# Patient Record
Sex: Female | Born: 1985 | Race: White | Hispanic: No | Marital: Single | Smoking: Current every day smoker
Health system: Southern US, Community
[De-identification: ages and names within clinical notes are randomized; demographics above are authoritative.]

## PROBLEM LIST (undated history)

## (undated) DIAGNOSIS — A630 Anogenital (venereal) warts: Secondary | ICD-10-CM

## (undated) DIAGNOSIS — F329 Major depressive disorder, single episode, unspecified: Secondary | ICD-10-CM

## (undated) DIAGNOSIS — F32A Depression, unspecified: Secondary | ICD-10-CM

## (undated) DIAGNOSIS — R87619 Unspecified abnormal cytological findings in specimens from cervix uteri: Secondary | ICD-10-CM

## (undated) HISTORY — DX: Anogenital (venereal) warts: A63.0

## (undated) HISTORY — PX: ADENOIDECTOMY: SUR15

## (undated) HISTORY — PX: COLPOSCOPY: SHX161

## (undated) HISTORY — DX: Depression, unspecified: F32.A

## (undated) HISTORY — DX: Major depressive disorder, single episode, unspecified: F32.9

## (undated) HISTORY — DX: Unspecified abnormal cytological findings in specimens from cervix uteri: R87.619

## (undated) HISTORY — PX: TONSILLECTOMY: SUR1361

---
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
## 2007-03-13 ENCOUNTER — Emergency Department (HOSPITAL_COMMUNITY): Admission: EM | Admit: 2007-03-13 | Discharge: 2007-03-13 | Payer: Self-pay | Admitting: Emergency Medicine

## 2007-06-04 ENCOUNTER — Emergency Department (HOSPITAL_COMMUNITY): Admission: EM | Admit: 2007-06-04 | Discharge: 2007-06-04 | Payer: Self-pay | Admitting: Emergency Medicine

## 2007-10-04 ENCOUNTER — Emergency Department (HOSPITAL_COMMUNITY): Admission: EM | Admit: 2007-10-04 | Discharge: 2007-10-04 | Payer: Self-pay | Admitting: Emergency Medicine

## 2007-11-20 ENCOUNTER — Encounter
Admission: RE | Admit: 2007-11-20 | Discharge: 2007-11-20 | Payer: Self-pay | Admitting: Physical Medicine & Rehabilitation

## 2008-06-19 ENCOUNTER — Emergency Department (HOSPITAL_COMMUNITY): Admission: EM | Admit: 2008-06-19 | Discharge: 2008-06-19 | Payer: Self-pay | Admitting: Emergency Medicine

## 2008-11-23 ENCOUNTER — Emergency Department (HOSPITAL_COMMUNITY): Admission: EM | Admit: 2008-11-23 | Discharge: 2008-11-23 | Payer: Self-pay | Admitting: Emergency Medicine

## 2010-03-16 ENCOUNTER — Ambulatory Visit (HOSPITAL_COMMUNITY): Admission: RE | Admit: 2010-03-16 | Discharge: 2010-03-16 | Payer: Self-pay | Admitting: Family Medicine

## 2010-06-16 ENCOUNTER — Emergency Department (HOSPITAL_COMMUNITY): Admission: EM | Admit: 2010-06-16 | Discharge: 2010-01-05 | Payer: Self-pay | Admitting: Emergency Medicine

## 2010-07-31 ENCOUNTER — Encounter: Payer: Self-pay | Admitting: General Surgery

## 2010-09-25 LAB — URINALYSIS, ROUTINE W REFLEX MICROSCOPIC
Bilirubin Urine: NEGATIVE
Ketones, ur: NEGATIVE mg/dL
Urobilinogen, UA: 0.2 mg/dL (ref 0.0–1.0)

## 2010-10-28 ENCOUNTER — Other Ambulatory Visit (HOSPITAL_COMMUNITY)
Admission: RE | Admit: 2010-10-28 | Discharge: 2010-10-28 | Disposition: A | Discharge status: Home / Self Care | Payer: Medicaid Other | Source: Ambulatory Visit | Attending: Obstetrics and Gynecology | Admitting: Obstetrics and Gynecology

## 2010-10-28 ENCOUNTER — Other Ambulatory Visit: Payer: Self-pay | Admitting: Adult Health

## 2010-10-28 DIAGNOSIS — Z01419 Encounter for gynecological examination (general) (routine) without abnormal findings: Secondary | ICD-10-CM | POA: Insufficient documentation

## 2010-10-28 DIAGNOSIS — Z113 Encounter for screening for infections with a predominantly sexual mode of transmission: Secondary | ICD-10-CM | POA: Insufficient documentation

## 2010-11-22 ENCOUNTER — Other Ambulatory Visit: Payer: Self-pay | Admitting: Obstetrics & Gynecology

## 2010-11-25 NOTE — Group Therapy Note (Signed)
NAMEMIYUKI, RZASA                 ACCOUNT NO.:  [id number]   MEDICAL RECORD NO.:  [id number]          PATIENT TYPE:  INP   LOCATION:  A401                          FACILITY:  [facility]   PHYSICIAN:  Tilda Burrow, M.D. DATE OF BIRTH:  10-28-1985   DATE OF PROCEDURE:  DATE OF DISCHARGE:                                   PROGRESS NOTE   Dorothy Strong finally started progressing through labor once her MVU's got up to  about 190-200, and she went from 6 cm to 10 cm and +3 station in  approximately 15 minutes.  I arrived to the birthing unit with the baby  noted to be crowning.  After about a 10 minute second stage, she had a  spontaneous vaginal delivery of a viable female infant at 1625 hours.  The  mouth and nose were suctioned with the bulb syringe prior to delivery of the  body.  A right compound hand was delivered first, and then the body  delivered rapidly after that.  Weight is 6 pounds, 15 ounces.  Apgars are 9  and 9.  Pitocin 20 units diluted in 1000 cc of lactated Ringer's was infused  rapidly IV.  The placenta separated spontaneously and delivered via  controlled cord traction and maternal pushing effort at 1629.  The placental  membranes separated from the placenta upon delivery and were left trailing  outside the introitus.  The placenta was inspected and appears to be intact  with a three vessel cord.  The membranes were grasped with a set of ring  forceps, twisted several times, and delivered with slow, steady traction.  It appears that all of the membranes were delivered, although I cannot be  100% sure that is the case.  A visual and digital inspection did not reveal  any further membranes.  The fundus was immediately firm, and very little  blood loss was noted.  Estimated blood loss 200 cc.  The vagina was then  inspected, and no lacerations were found.  The epidural catheter was then  removed with the blue tip visualized as being intact.      Dorothy Strong,  C.N.M.      Tilda Burrow, M.D.  Electronically Signed    FC/MEDQ  D:  12/19/2005  T:  12/19/2005  Job:  [id number]

## 2010-11-25 NOTE — Op Note (Signed)
[place]  Patient:    Dorothy Strong, Dorothy Strong Visit Number: [id number] MRN: [id number]          Service Type: OBV Location: PEDS 6152 01 Attending Physician:  Lucky Cowboy Dictated by:   Lucky Cowboy, M.D. Proc. Date: 08/31/01 Admit Date:  08/31/2001 Discharge Date: 09/01/2001   CC:         [facility]   Operative Report  PREOPERATIVE DIAGNOSIS:  Postoperative tonsillectomy hemorrhage.  POSTOPERATIVE DIAGNOSIS:  Postoperative tonsillectomy hemorrhage.  PROCEDURE:  Control of oropharyngeal hemorrhage.  SURGEON:  Lucky Cowboy, M.D.  ANESTHESIA:  General endotracheal anesthesia.  ESTIMATED BLOOD LOSS:  30 cc - 250 cc suctioned out from stomach which was old blood.  COMPLICATIONS:  None.  INDICATIONS:  This patient is a 25 year old female who underwent tonsillectomy on August 23, 2001.  Approximately 1-1/2 hours ago she developed profuse oral bleeding which lasted for approximately _____  hours.  In transient to the hospital, she also bled again and also bled as well on arrival on the pediatric floor.  FINDINGS:  The patient was noted to have active bleeding from the right superior pole.  This was controlled with cautery.  There was also a small amount of bleeding from the left mid pole with fluffing of the eschar.  DESCRIPTION OF PROCEDURE: The patient was taken to the operating room and placed on the table in the supine position.  She was then placed under general endotracheal anesthesia and the table rotated counterclockwise 90 degrees. The neck was gently extended using a shoulder roll.  The head and body were draped.  The eyes were taped shut.  Crowe-Davis mouth gag with a #3 tongue blade was then placed intraorally, opened and suspended on the Mayo stand.  A large clot was then removed from the right tonsillary fossa.  Active bleeding was encountered and controlled with suction cautery.  In the same light, eschar was  removed.  As it was fluffing off from the left tonsillar fossa in the mid pole, venous bleeding controlled using suction cautery.  Mouth gag was relaxed and reopened.  There was no residual bleeding.  The oral cavity was irrigated with normal saline which was suctioned out.  An NG tube was placed down the esophagus for suctioning of the gastric contents and 250 cc of old blood was evacuated.   The mouth gag was then removed noting no damage to the teeth or soft tissues.  The table was rotated clockwise 90 degrees to its original position.  The patient was awakened from anesthesia and extubated in the operating room.  She was taken to the post anesthesia care unit in stable condition.  There were no complications. Dictated by:   Lucky Cowboy, M.D. Attending Physician:  Lucky Cowboy DD:  08/31/01 TD:  09/01/01 Job: [id number]

## 2010-11-25 NOTE — Op Note (Signed)
[place]  Patient:    KARLENE, SOUTHARD Visit Number: [id number] MRN: [id number]          Service Type: DSU Location: [facility] Attending Physician:  Fernande Boyden Dictated by:   Gloris Manchester. Lazarus Salines, M.D. Proc. Date: 08/23/01 Admit Date:  08/23/2001   CC:         Dr. Phillips Odor                           Operative Report  PREOPERATIVE DIAGNOSIS:  Recurrent tonsillitis.  POSTOPERATIVE DIAGNOSIS:  Recurrent tonsillitis.  PROCEDURE:  Tonsillectomy, adenoidectomy.  SURGEON: Gloris Manchester. Lazarus Salines, M.D.  ANESTHESIA:  General orotracheal.  ESTIMATED BLOOD LOSS:  Minimal.  COMPLICATIONS:  None.  FINDINGS:  Somewhat congested anterior nose.  Embedded 2+ tonsils with some fibrosis.  Normal soft palate.  Moderate adenoid pad.  DESCRIPTION OF PROCEDURE:  With the patient in a comfortable supine position, general orotracheal anesthesia was induced without difficulty.  At an appropriate level, the table was turned 90 degrees and the patient placed in Trendelenburg.  A clean preparation and draping was accomplished.  Taking care to protect lips, teeth, and endotracheal tube, the Crowe-Davis mouth gag was introduced, expanded for visualization, and suspended from the Mayo stand in the standard fashion.  The findings were as described above.  The palate retractor were used to visualize the nasopharynx with the findings as described above.  Finally a nasal speculum was used to examine the anterior nose with the findings as described above.  Xylocaine 0.5% with 1:200,000 epinephrine, 7 cc total, was infiltrated into the peritonsillar planes for intraoperative hemostasis.  Several minutes were allowed for this to take effect.  The adenoid pad was freed from the nasopharynx in a single sharp pass in the midline using adenoid curettes.  The adenoids were removed from the field and passed off as specimen.  The nasopharynx was suctioned dry and packed  with saline-moistened tonsil sponges for hemostasis.  Beginning on the left side, the tonsil was grasped and retracted medially. The mucosa overlying the anterior and superior poles was coagulated and then cut down to the capsule of the tonsil.  Using the cautery tip as a blunt dissector, coagulating crossing vessels, and lysing fibrous bands as identified, the tonsil was dissected free of its muscular fossa from inferiorly upward.  The tonsil was removed in its entirety as determined by examination of both tonsil and fossa.  A small additional quantity of cautery rendered the fossa hemostatic.  After completing the left side, the right side was done in identical fashion.  After completing the tonsillectomy and rendering the oropharynx hemostatic, the nasopharynx was unpacked.  The red rubber catheter was passed through the nose and out the mouth to serve as a palate retractor.  Using suction cautery and indirect visualization, moderate lateral bands were ablated and the adenoid bed proper was coagulated for hemostasis.  The posterior pole of the inferior turbinates, which was very congested, was reduced with coagulation on both sides.  Upon achieving hemostasis in the nasopharynx, the oropharynx was again observed to be hemostatic.  At this point the palate retractor and mouth gag were relaxed for several minutes.  Upon re-expansion, hemostasis was persistent.  At this point the procedure was completed.  The palate retractor and mouth gag were relaxed and removed.  The dental status was intact.  The patient was returned to anesthesia, awakened, extubated, and transferred to recovery in stable  condition.  COMMENT:  A 25 year old white female with recurrent tonsillitis, including hypertrophy during infection with difficulty swallowing, was the indication for todays procedure.  Anticipate a routine postoperative recovery with attention to analgesia, antibiosis, hydration, and observation  for bleeding, emesis, or airway compromise. Dictated by:   Gloris Manchester. Lazarus Salines, M.D. Attending Physician:  Fernande Boyden DD:  08/23/01 TD:  08/23/01 Job: [id number]

## 2010-11-25 NOTE — H&P (Signed)
Dorothy Strong, Dorothy Strong                 ACCOUNT NO.:  [id number]   MEDICAL RECORD NO.:  [id number]          PATIENT TYPE:  INP   LOCATION:  LDR2                          FACILITY:  [facility]   PHYSICIAN:  Tilda Burrow, M.D. DATE OF BIRTH:  Nov 25, 1985   DATE OF ADMISSION:  12/18/2005  DATE OF DISCHARGE:  LH                                HISTORY & PHYSICAL   ADMISSION DIAGNOSES:  1.  Pregnancy at 40-1/[redacted] weeks gestation.  2.  Impending post dates.  3.  Medical induction of labor.   HISTORY OF PRESENT ILLNESS:  This 26 year old female, gravida 1, para 0, LMP  February 07, 2005, placing [facility] May 8 with ultrasound-corrected [facility] of June 7  based on 5 week 6 day ultrasound and confirmed at 20 weeks.  She is now 40  weeks and a few days of this criteria, has cervix which has softened to 1  cm, 25%, -1, mid position. Vertex presentation is confirmed.  She is  admitted for Foley bulb cervical ripening and Pitocin induction of labor.   PAST MEDICAL HISTORY:  1.  Notable for supraventricular tachycardia  as a child as the patient was      a premature, delivered at 8 months gestation.  2.  History of depression.   PAST SURGICAL HISTORY:  Tonsillectomy   HABITS:  Cigarettes: 1 pack per day, early in pregnancy, quit.   ALLERGIES:  None.   SOCIAL HISTORY:  Single, lives with Ella Bodo.  She plans to bottle feed.  The baby is a girl.  She plans to take the baby to [facility], Dr. Phillips Odor.   PHYSICAL EXAMINATION:  GENERAL:  Healthy-appearing Caucasian female resting  comfortably.  Fetal heart rate tracing shows anterior padding.  VITAL SIGNS: Height 5 feet 5 inches, weight 145 which is a 33-pound weight  gain.  Fundal height 35 cm.  Estimated fetal weight 7 pounds.  PELVIC: Cervix 1, 25, -1.   LABORATORY DATA:  Pertinent labs include blood type O positive. Rubella  immunity present.  Hemoglobin 14, hematocrit 42.  Hepatitis, HIV, RPR, GC,  and chlamydia are all negative.  MSAFP  is normal.  The patient is going to  be supported by Amalia Hailey and her mother, Sheppard Evens.      Tilda Burrow, M.D.  Electronically Signed     JVF/MEDQ  D:  12/18/2005  T:  12/18/2005  Job:  [id number]   cc:   [facility]

## 2010-11-25 NOTE — Op Note (Signed)
NAMEASHLON, LOTTMAN NO.:  [id number]   MEDICAL RECORD NO.:  [id number]          PATIENT TYPE:  INP   LOCATION:  LDR2                          FACILITY:  [facility]   PHYSICIAN:  Lazaro Arms, M.D.   DATE OF BIRTH:  03-14-1986   DATE OF PROCEDURE:  DATE OF DISCHARGE:                                 OPERATIVE REPORT   EPIDURAL NOTE   Leshay is a 25 year old, white female, gravida 1, para 0, being induced with  her cervix being 3, 50 and -2 having regular contractions.  She has had two  doses of pain medicine and requesting epidural.   The patient is placed in the sitting position, Betadine prep is used and 1%  lidocaine is injected as a local anesthetic.  A 17-Gauge Tuohy needle was  used and loss of resistance technique employed.  The epidural space was  found with one pass without difficulty using loss of resistance technique.  Ten cc of 0.125% bupivacaine plain is given as a test dose without ill  effects.  The epidural catheter is fed and additional 10 cc is given through  the epidural catheter.  It was taped down 5 cm in the epidural space.  The  patient tolerated the procedure well.  She experienced no drop in blood  pressure.  Fetal heart rate tracing is stable.      Lazaro Arms, M.D.  Electronically Signed     LHE/MEDQ  D:  12/19/2005  T:  12/19/2005  Job:  [id number]

## 2011-01-27 ENCOUNTER — Ambulatory Visit (HOSPITAL_COMMUNITY)
Admission: RE | Admit: 2011-01-27 | Discharge: 2011-01-27 | Disposition: A | Discharge status: Home / Self Care | Payer: Medicaid Other | Source: Ambulatory Visit | Attending: Family Medicine | Admitting: Family Medicine

## 2011-01-27 ENCOUNTER — Other Ambulatory Visit (HOSPITAL_COMMUNITY): Payer: Self-pay | Admitting: Family Medicine

## 2011-01-27 DIAGNOSIS — X58XXXA Exposure to other specified factors, initial encounter: Secondary | ICD-10-CM | POA: Insufficient documentation

## 2011-01-27 DIAGNOSIS — M25476 Effusion, unspecified foot: Secondary | ICD-10-CM | POA: Insufficient documentation

## 2011-01-27 DIAGNOSIS — M779 Enthesopathy, unspecified: Secondary | ICD-10-CM

## 2011-01-27 DIAGNOSIS — M25579 Pain in unspecified ankle and joints of unspecified foot: Secondary | ICD-10-CM | POA: Insufficient documentation

## 2011-01-27 DIAGNOSIS — M25473 Effusion, unspecified ankle: Secondary | ICD-10-CM | POA: Insufficient documentation

## 2011-01-27 DIAGNOSIS — S92919A Unspecified fracture of unspecified toe(s), initial encounter for closed fracture: Secondary | ICD-10-CM | POA: Insufficient documentation

## 2011-04-02 ENCOUNTER — Encounter: Payer: Self-pay | Admitting: *Deleted

## 2011-04-02 ENCOUNTER — Emergency Department (HOSPITAL_COMMUNITY)
Admission: EM | Admit: 2011-04-02 | Discharge: 2011-04-02 | Discharge status: Against Medical Advice | Payer: Medicaid Other | Attending: Emergency Medicine | Admitting: Emergency Medicine

## 2011-04-02 DIAGNOSIS — R109 Unspecified abdominal pain: Secondary | ICD-10-CM | POA: Insufficient documentation

## 2011-04-02 DIAGNOSIS — Z5320 Procedure and treatment not carried out because of patient's decision for unspecified reasons: Secondary | ICD-10-CM | POA: Insufficient documentation

## 2011-04-02 NOTE — ED Notes (Signed)
Pt c/o pink discharge and lower abd pain x 1 day

## 2011-04-03 LAB — URINALYSIS, ROUTINE W REFLEX MICROSCOPIC
Bilirubin Urine: NEGATIVE
Ketones, ur: NEGATIVE
Urobilinogen, UA: 0.2
pH: 6

## 2011-04-03 LAB — WET PREP, GENITAL: Yeast Wet Prep HPF POC: NONE SEEN

## 2011-04-03 LAB — GC/CHLAMYDIA PROBE AMP, GENITAL: GC Probe Amp, Genital: NEGATIVE

## 2011-04-14 LAB — URINE CULTURE: Colony Count: NO GROWTH

## 2011-04-14 LAB — URINALYSIS, ROUTINE W REFLEX MICROSCOPIC
Glucose, UA: NEGATIVE mg/dL
Hgb urine dipstick: NEGATIVE
Ketones, ur: NEGATIVE mg/dL
Leukocytes, UA: NEGATIVE
Nitrite: NEGATIVE
Protein, ur: 30 mg/dL — AB
Specific Gravity, Urine: >1.03 — ABNORMAL HIGH (ref 1.005–1.030)
Urobilinogen, UA: 0.2 mg/dL (ref 0.0–1.0)

## 2011-04-14 LAB — URINE MICROSCOPIC-ADD ON

## 2011-09-13 ENCOUNTER — Emergency Department (HOSPITAL_COMMUNITY)
Admission: EM | Admit: 2011-09-13 | Discharge: 2011-09-13 | Disposition: A | Discharge status: Home / Self Care | Payer: 59 | Attending: Emergency Medicine | Admitting: Emergency Medicine

## 2011-09-13 ENCOUNTER — Encounter (HOSPITAL_COMMUNITY): Payer: Self-pay | Admitting: *Deleted

## 2011-09-13 DIAGNOSIS — F17200 Nicotine dependence, unspecified, uncomplicated: Secondary | ICD-10-CM | POA: Insufficient documentation

## 2011-09-13 DIAGNOSIS — R61 Generalized hyperhidrosis: Secondary | ICD-10-CM | POA: Insufficient documentation

## 2011-09-13 DIAGNOSIS — F1120 Opioid dependence, uncomplicated: Secondary | ICD-10-CM | POA: Insufficient documentation

## 2011-09-13 DIAGNOSIS — R197 Diarrhea, unspecified: Secondary | ICD-10-CM | POA: Insufficient documentation

## 2011-09-13 LAB — BASIC METABOLIC PANEL
BUN: 8 mg/dL (ref 6–23)
CO2: 28 mEq/L (ref 19–32)
Chloride: 103 mEq/L (ref 96–112)
Creatinine, Ser: 0.55 mg/dL (ref 0.50–1.10)
Glucose, Bld: 85 mg/dL (ref 70–99)

## 2011-09-13 LAB — DIFFERENTIAL
Lymphocytes Relative: 37 % (ref 12–46)
Monocytes Absolute: 0.9 10*3/uL (ref 0.1–1.0)
Monocytes Relative: 9 % (ref 3–12)
Neutro Abs: 5.3 10*3/uL (ref 1.7–7.7)

## 2011-09-13 LAB — CBC
HCT: 39.1 % (ref 36.0–46.0)
Hemoglobin: 13.8 g/dL (ref 12.0–15.0)
MCHC: 35.3 g/dL (ref 30.0–36.0)
WBC: 10.4 10*3/uL (ref 4.0–10.5)

## 2011-09-13 NOTE — ED Notes (Signed)
Evaluation for involuntary commitment

## 2011-09-13 NOTE — ED Provider Notes (Signed)
History  This chart was scribed for Loren Racer, MD by Bennett Scrape. This patient was seen in room APA15/APA15 and the patient's care was started at 5:21PM.  CSN: [id number]  Arrival date & time 09/13/11  1535   First MD Initiated Contact with Patient 09/13/11 1706      Chief Complaint  Patient presents with  . V70.1    The history is provided by the patient. No language interpreter was used.    Dorothy Strong is a 26 y.o. female who presents to the Emergency Department for involuntary commitment. Pt states that her mother called the police after receiving a text from an ex-boyfriend stating that she was using heroin. Pt states that her mother expressed concern for her granddaughter's safety if drug use was occuring in the pt's home. Pt denies these allegations but does admit to weaning herself of oxycodone after a long battle with abuse. Pt states that she has weaned herself down to a 1/2 a pill once a day. She c/o diarrhea and hot/cold sweats that she attributes to the withdrawal process.  She denies having any SI, HI or hallucinations. She denies having any previous  Overdoses. She denies having any previous psychiatric diagnoses. She denies having any other illnesses or injuries at this time. She has no h/o chronic medical conditions. She is a current everyday smoker but denies alcohol use.  History reviewed. No pertinent past medical history.  Past Surgical History  Procedure Date  . Tonsillectomy   . Adenoidectomy     No family history on file.  History  Substance Use Topics  . Smoking status: Current Everyday Smoker  . Smokeless tobacco: Not on file  . Alcohol Use: No     Review of Systems  Constitutional: Positive for chills. Negative for fever.  HENT: Negative for congestion, sore throat and neck pain.   Eyes: Negative for pain.  Respiratory: Negative for cough and shortness of breath.   Cardiovascular: Negative for chest pain.  Gastrointestinal: Positive for  diarrhea. Negative for nausea, vomiting and abdominal pain.  Genitourinary: Negative for dysuria and hematuria.  Musculoskeletal: Negative for back pain.  Skin: Negative for rash.  Neurological: Negative for numbness and headaches.  Psychiatric/Behavioral: Negative for suicidal ideas, hallucinations and confusion.    Allergies  Review of patient's allergies indicates no known allergies.  Home Medications   Current Outpatient Rx  Name Route Sig Dispense Refill  . ALBUTEROL SULFATE HFA 108 (90 BASE) MCG/ACT IN AERS Inhalation Inhale 2 puffs into the lungs every 6 (six) hours as needed. For rescue    . ALPRAZOLAM 1 MG PO TABS Oral Take 1 mg by mouth daily as needed. For anxiety    . UNKNOWN TO PATIENT Topical Apply 1 application topically daily. CREAM FOR ECZEMA    . IMPLANON [place] Subcutaneous Inject into the skin.        Triage Vitals: BP 125/78  Pulse 102  Temp(Src) 97.7 F (36.5 C) (Oral)  Resp 20  Ht 5\' 5"  (1.651 m)  Wt 125 lb (56.7 kg)  BMI 20.80 kg/m2  SpO2 100%  Physical Exam  Nursing note and vitals reviewed. Constitutional: She is oriented to person, place, and time. She appears well-developed and well-nourished.  HENT:  Head: Normocephalic and atraumatic.       Moist  Eyes: EOM are normal. Pupils are equal, round, and reactive to light.  Neck: Normal range of motion. Neck supple.  Cardiovascular: Normal rate and regular rhythm.   Pulmonary/Chest: Effort normal  and breath sounds normal. No respiratory distress.  Abdominal: Soft. She exhibits no distension. There is no tenderness.  Musculoskeletal: Normal range of motion. She exhibits no edema and no tenderness.  Neurological: She is alert and oriented to person, place, and time. No cranial nerve deficit.       5 out 5 strength, coordination intact, neurologically intact  Skin: Skin is warm and dry.  Psychiatric: She has a normal mood and affect. Her behavior is normal.       No SI, no HI, no psychosis  hallucinations, pt has good insight    ED Course  Procedures (including critical care time)  DIAGNOSTIC STUDIES: Oxygen Saturation is 100% on room air, normal by my interpretation.    COORDINATION OF CARE: 5:30Pm-Discussed treatment plan with pt and pt agreed to plan. 6:17PM-[facility] evaluated pt and decided to reverse the involuntary commitment. Pt will be discharged home.  Labs Reviewed  BASIC METABOLIC PANEL - Abnormal; Notable for the following:    Potassium 3.4 (*)    All other components within normal limits  CBC  DIFFERENTIAL  ETHANOL  URINE RAPID DRUG SCREEN (HOSP PERFORMED)  HCG, SERUM, QUALITATIVE   No results found.   1. Opioid dependence       MDM    I personally performed the services described in this documentation, which was scribed in my presence. The recorded information has been reviewed and considered.   Agree with findings of psychiatrist.    Loren Racer, MD 09/13/11 [phone]

## 2011-09-13 NOTE — ED Notes (Signed)
Per [facility] pt is to be discharged home,

## 2011-09-13 NOTE — ED Notes (Signed)
Pt presents to ed with [facility] with IVC paperwork, pt states that she is here because her boyfriend went to pt's mother and told her that pt had been "shooting up", pt tearful, denies any SI/HI, pt does admit to using narcotic pain medication but states that she has slowly weaned herself off them, pt states that she currently takes a half a tablet a day, does admit to GI upset since weaning her dosage down. Pt cooperative, [employer] at bedside,

## 2011-09-13 NOTE — ED Notes (Signed)
Pt's mother Dorothy Strong called and requested to speak with edp, adivsed pt's mother that no information could be given out without pt's permission, pt refused to given permission for anyone to speak with her mother.

## 2011-09-13 NOTE — ED Notes (Signed)
Patient is accompanied by [facility]

## 2012-03-19 ENCOUNTER — Other Ambulatory Visit: Payer: Self-pay | Admitting: Adult Health

## 2012-03-19 ENCOUNTER — Other Ambulatory Visit (HOSPITAL_COMMUNITY)
Admission: RE | Admit: 2012-03-19 | Discharge: 2012-03-19 | Disposition: A | Discharge status: Home / Self Care | Payer: Medicaid Other | Source: Ambulatory Visit | Attending: Obstetrics and Gynecology | Admitting: Obstetrics and Gynecology

## 2012-03-19 DIAGNOSIS — Z3049 Encounter for surveillance of other contraceptives: Secondary | ICD-10-CM | POA: Insufficient documentation

## 2012-03-19 DIAGNOSIS — Z113 Encounter for screening for infections with a predominantly sexual mode of transmission: Secondary | ICD-10-CM | POA: Insufficient documentation

## 2012-04-13 ENCOUNTER — Emergency Department (HOSPITAL_COMMUNITY)
Admission: EM | Admit: 2012-04-13 | Discharge: 2012-04-14 | Disposition: A | Discharge status: Home / Self Care | Payer: Medicaid Other | Attending: Emergency Medicine | Admitting: Emergency Medicine

## 2012-04-13 ENCOUNTER — Encounter (HOSPITAL_COMMUNITY): Payer: Self-pay | Admitting: *Deleted

## 2012-04-13 DIAGNOSIS — F17200 Nicotine dependence, unspecified, uncomplicated: Secondary | ICD-10-CM | POA: Insufficient documentation

## 2012-04-13 DIAGNOSIS — E860 Dehydration: Secondary | ICD-10-CM | POA: Insufficient documentation

## 2012-04-13 DIAGNOSIS — N12 Tubulo-interstitial nephritis, not specified as acute or chronic: Secondary | ICD-10-CM | POA: Insufficient documentation

## 2012-04-13 DIAGNOSIS — R809 Proteinuria, unspecified: Secondary | ICD-10-CM

## 2012-04-13 MED ORDER — KETOROLAC TROMETHAMINE 30 MG/ML IJ SOLN
30.0000 mg | Freq: Once | INTRAMUSCULAR | Status: AC
  Administered 2012-04-14: 30 mg via INTRAVENOUS
  Filled 2012-04-13: qty 1

## 2012-04-13 MED ORDER — ONDANSETRON HCL 4 MG/2ML IJ SOLN
4.0000 mg | Freq: Once | INTRAMUSCULAR | Status: AC
  Administered 2012-04-14: 4 mg via INTRAVENOUS
  Filled 2012-04-13: qty 2

## 2012-04-13 MED ORDER — SODIUM CHLORIDE 0.9 % IV BOLUS (SEPSIS)
1000.0000 mL | Freq: Once | INTRAVENOUS | Status: AC
  Administered 2012-04-14: 1000 mL via INTRAVENOUS

## 2012-04-13 NOTE — ED Provider Notes (Addendum)
History   This chart was scribed for Vida Roller, MD scribed by Magnus Sinning. The patient was seen in room APA01/APA01 at 23:53   CSN: [id number]  Arrival date & time 04/13/12  2321    Chief Complaint  Patient presents with  . Flank Pain    (Consider location/radiation/quality/duration/timing/severity/associated sxs/prior treatment) The history is provided by the patient.   Dorothy Strong is a 26 y.o. female who presents to the Emergency Department complaining of gradual onset of urinary frequency and constant flank pain that has become moderate to severe. The onset was yesterday with associated HA, increased urinary frequency at every 30 minutes, mild nausea, and a fever of 103. Nothing makes this better or worse, she tried AZO without improvement. States she has hx of urinary infections and explains she tried to drink fluids and says she has taken azo and ibuprofen with minimal relief. She denies hx of kidney infections,dysuria, cough, SOB, rash,vaginal bleeding, vaginal discharge, or abd surgeries,DM, hepatitis, or HIV.  She admits to headache  History reviewed. No pertinent past medical history.  Past Surgical History  Procedure Date  . Tonsillectomy   . Adenoidectomy     History reviewed. No pertinent family history.  History  Substance Use Topics  . Smoking status: Current Every Day Smoker -- 1.0 packs/day    Types: Cigarettes  . Smokeless tobacco: Not on file  . Alcohol Use: No   Review of Systems  All other systems reviewed and are negative.   10 Systems reviewed and are negative for acute change except as noted in the HPI. Allergies  Review of patient's allergies indicates no known allergies.  Home Medications   Current Outpatient Rx  Name Route Sig Dispense Refill  . ALBUTEROL SULFATE HFA 108 (90 BASE) MCG/ACT IN AERS Inhalation Inhale 2 puffs into the lungs every 6 (six) hours as needed. For rescue    . ALPRAZOLAM 1 MG PO TABS Oral Take 1 mg by mouth  daily as needed. For anxiety    . CEPHALEXIN 500 MG PO CAPS Oral Take 1 capsule (500 mg total) by mouth 4 (four) times daily. 40 capsule 0  . IMPLANON [place] Subcutaneous Inject into the skin.      Marland Kitchen NAPROXEN 500 MG PO TABS Oral Take 1 tablet (500 mg total) by mouth 2 (two) times daily with a meal. 30 tablet 0  . OXYCODONE-ACETAMINOPHEN 5-325 MG PO TABS Oral Take 1 tablet by mouth every 4 (four) hours as needed for pain. 20 tablet 0  . PROMETHAZINE HCL 25 MG PO TABS Oral Take 1 tablet (25 mg total) by mouth every 6 (six) hours as needed for nausea. 12 tablet 0  . UNKNOWN TO PATIENT Topical Apply 1 application topically daily. CREAM FOR ECZEMA      BP 124/70  Pulse 127  Temp 99.3 F (37.4 C) (Oral)  Resp 16  Ht 5\' 5"  (1.651 m)  Wt 130 lb (58.968 kg)  BMI 21.63 kg/m2  SpO2 94%  Physical Exam  Nursing note and vitals reviewed. Constitutional: She is oriented to person, place, and time. She appears well-developed and well-nourished. No distress.  HENT:  Head: Normocephalic and atraumatic.  Eyes: Conjunctivae normal and EOM are normal.  Neck: Neck supple. No tracheal deviation present.  Cardiovascular: Tachycardia present.        Tachycardic at 110  Pulmonary/Chest: Effort normal. No respiratory distress.  Abdominal: She exhibits no distension. There is tenderness.       Suprapubic tenderness but  no pain at McBurneys point. Right CVA tenderness.  Musculoskeletal: Normal range of motion.  Neurological: She is alert and oriented to person, place, and time. No sensory deficit.  Skin: Skin is dry.  Psychiatric: She has a normal mood and affect. Her behavior is normal.    ED Course  Procedures (including critical care time) DIAGNOSTIC STUDIES: Oxygen Saturation is 94% on room air, normal by my interpretation.    COORDINATION OF CARE:  Labs Reviewed  URINALYSIS, ROUTINE W REFLEX MICROSCOPIC - Abnormal; Notable for the following:    Color, Urine ORANGE (*)  BIOCHEMICALS MAY BE AFFECTED  BY COLOR   Glucose, UA 250 (*)     Hgb urine dipstick TRACE (*)     Bilirubin Urine MODERATE (*)     Ketones, ur >80 (*)     Protein, ur >300 (*)     Urobilinogen, UA >8.0 (*)     Nitrite POSITIVE (*)     Leukocytes, UA MODERATE (*)     All other components within normal limits  URINE MICROSCOPIC-ADD ON - Abnormal; Notable for the following:    Squamous Epithelial / LPF MANY (*)     Bacteria, UA MANY (*)     All other components within normal limits  PREGNANCY, URINE   No results found.   1. Pyelonephritis   2. Dehydration   3. Proteinuria       MDM  The patient has tachycardia, flank pain with urinary infections she has pyelonephritis clinically. She has had fever as high as 103 at home though she does not have a fever here. She will be given Tylenol, Rocephin, urine has been cultured as this is a complicated infection and the patient will likely be discharged home as she is tolerating by mouth. Will reduce tachycardia with fluids and medications, reevaluate.  Urinalysis shows many bacteria and the left and to 20 white blood cells with positive nitrite and moderate leukocytes. She also has a deep plus ketones in her urine and proteinuria. She has been informed of these results and agrees to followup for further testing should her symptoms worsen. She has been given IV rehydration with normal saline, intravenous Rocephin, Toradol, Zofran, acetaminophen.  Filed Vitals:   04/13/12 2335 04/14/12 0153  BP: 124/70 112/64  Pulse: 127 100  Temp: 99.3 F (37.4 C) 98.5 F (36.9 C)  TempSrc: Oral Oral  Resp: 16   Height: 5\' 5"  (1.651 m)   Weight: 130 lb (58.968 kg)   SpO2: 94% 96%   She has remained afebrile and has had a stable blood pressure. Her tachycardia has resolved with fluids and medications. She appears stable for discharge and has been made aware of all of her results and need for followup.  I personally performed the services described in this documentation, which was  scribed in my presence. The recorded information has been reviewed and considered.           Vida Roller, MD 04/14/12 0157  Vida Roller, MD 04/14/12 [phone]

## 2012-04-13 NOTE — ED Notes (Signed)
Pt c/o right sided flank pain starting yesterday. Pt also c/o headache with fever.

## 2012-04-14 LAB — PREGNANCY, URINE: Preg Test, Ur: NEGATIVE

## 2012-04-14 LAB — URINE MICROSCOPIC-ADD ON

## 2012-04-14 LAB — URINALYSIS, ROUTINE W REFLEX MICROSCOPIC
Glucose, UA: 250 mg/dL — AB
Specific Gravity, Urine: 1.025 (ref 1.005–1.030)

## 2012-04-14 MED ORDER — NAPROXEN 500 MG PO TABS: 500.0000 mg | ORAL_TABLET | Freq: Two times a day (BID) | ORAL | Status: DC

## 2012-04-14 MED ORDER — ACETAMINOPHEN 500 MG PO TABS
1000.0000 mg | ORAL_TABLET | Freq: Once | ORAL | Status: AC
  Administered 2012-04-14: 1000 mg via ORAL
  Filled 2012-04-14: qty 2

## 2012-04-14 MED ORDER — CEFTRIAXONE SODIUM 1 G IJ SOLR
INTRAMUSCULAR | Status: AC
  Filled 2012-04-14: qty 10

## 2012-04-14 MED ORDER — CEPHALEXIN 500 MG PO CAPS: 500.0000 mg | ORAL_CAPSULE | Freq: Four times a day (QID) | ORAL | Status: DC

## 2012-04-14 MED ORDER — PROMETHAZINE HCL 25 MG PO TABS: 25.0000 mg | ORAL_TABLET | Freq: Four times a day (QID) | ORAL | Status: DC | PRN

## 2012-04-14 MED ORDER — DEXTROSE 5 % IV SOLN
1.0000 g | Freq: Once | INTRAVENOUS | Status: AC
  Administered 2012-04-14: 1 g via INTRAVENOUS

## 2012-04-14 MED ORDER — OXYCODONE-ACETAMINOPHEN 5-325 MG PO TABS: 1.0000 | ORAL_TABLET | ORAL | Status: DC | PRN

## 2012-04-14 NOTE — ED Notes (Signed)
Pt discharged. Pt stable at time of discharge. Medications reviewed pt has no questions regarding discharge at this time. Pt voiced understanding of discharge instructions.  

## 2013-04-10 LAB — HM PAP SMEAR

## 2013-06-17 ENCOUNTER — Other Ambulatory Visit (HOSPITAL_COMMUNITY)
Admission: RE | Admit: 2013-06-17 | Discharge: 2013-06-17 | Disposition: A | Discharge status: Home / Self Care | Payer: Medicaid Other | Source: Ambulatory Visit | Attending: Nurse Practitioner | Admitting: Nurse Practitioner

## 2013-06-17 ENCOUNTER — Other Ambulatory Visit (HOSPITAL_COMMUNITY)
Admission: RE | Admit: 2013-06-17 | Discharge: 2013-06-17 | Disposition: A | Discharge status: Home / Self Care | Payer: Medicaid Other | Source: Ambulatory Visit | Attending: Unknown Physician Specialty | Admitting: Unknown Physician Specialty

## 2013-06-17 DIAGNOSIS — D069 Carcinoma in situ of cervix, unspecified: Secondary | ICD-10-CM | POA: Insufficient documentation

## 2013-06-17 DIAGNOSIS — R87623 High grade squamous intraepithelial lesion on cytologic smear of vagina (HGSIL): Secondary | ICD-10-CM | POA: Insufficient documentation

## 2013-07-21 ENCOUNTER — Encounter: Payer: Self-pay | Admitting: *Deleted

## 2013-07-21 DIAGNOSIS — IMO0002 Reserved for concepts with insufficient information to code with codable children: Secondary | ICD-10-CM | POA: Insufficient documentation

## 2013-07-21 DIAGNOSIS — A630 Anogenital (venereal) warts: Secondary | ICD-10-CM | POA: Insufficient documentation

## 2013-07-21 DIAGNOSIS — R87619 Unspecified abnormal cytological findings in specimens from cervix uteri: Secondary | ICD-10-CM | POA: Insufficient documentation

## 2013-07-30 ENCOUNTER — Ambulatory Visit: Payer: Self-pay | Admitting: Obstetrics and Gynecology

## 2013-07-30 ENCOUNTER — Encounter: Payer: Self-pay | Admitting: Obstetrics and Gynecology

## 2013-07-30 VITALS — BP 100/60 | Ht 65.0 in | Wt 137.0 lb

## 2013-07-30 DIAGNOSIS — N879 Dysplasia of cervix uteri, unspecified: Secondary | ICD-10-CM

## 2013-07-31 NOTE — Progress Notes (Signed)
Patient ID: Dorothy SaxEmily A Knowlton, female   DOB: [birth year], 28 y.o.   MRN: [id number]  Chief Complaint  Patient presents with  . [facility]    HPI Dorothy Strong is a 28 y.o. female.  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 Records are not available. The patient came to her appointment in approximately 3 PM, went to the bathroom, was thought of left then returned to the waiting room and at 420 and was realized that the patient was still here. Arrangements for LEEP are not considered reasonable in this late times a day and will be rescheduled for better time she now The patient is accepting of rescheduling HPI  Past Medical History  Diagnosis Date  . Condyloma acuminatum   . Depression   . Abnormal Pap smear of cervix     Past Surgical History  Procedure Laterality Date  . Tonsillectomy    . Adenoidectomy    . Colposcopy      Family History  Problem Relation Age of Onset  . Cancer Other     prostate   . Diabetes Other   . Arthritis Other   . Hypertension Other   . Hypertension Father     Social History History  Substance Use Topics  . Smoking status: Current Every Day Smoker -- 1.00 packs/day    Types: Cigarettes  . Smokeless tobacco: Never Used  . Alcohol Use: No    No Known Allergies  Current Outpatient Prescriptions  Medication Sig Dispense Refill  . Etonogestrel (IMPLANON [place]) Inject into the skin.         No current facility-administered medications for this visit.    Review of Systems Review of Systems  Blood pressure 100/60, height 5\' 5"  (1.651 m), weight 137 lb (62.143 kg).  Physical Exam Physical Exam  Data Reviewed Records request made  Assessment    Cervical dysplasia for LEEP procedure are office, records being requested and procedure being rescheduled    Plan           Cinthya Bors V 07/31/2013, 5:31 PM

## 2013-08-05 ENCOUNTER — Other Ambulatory Visit: Payer: Self-pay | Admitting: Obstetrics and Gynecology

## 2013-08-05 ENCOUNTER — Ambulatory Visit (INDEPENDENT_AMBULATORY_CARE_PROVIDER_SITE_OTHER): Payer: Self-pay | Admitting: Obstetrics and Gynecology

## 2013-08-05 ENCOUNTER — Encounter: Payer: Self-pay | Admitting: Obstetrics and Gynecology

## 2013-08-05 VITALS — Ht 65.0 in | Wt 139.0 lb

## 2013-08-05 DIAGNOSIS — N871 Moderate cervical dysplasia: Secondary | ICD-10-CM

## 2013-08-05 DIAGNOSIS — D069 Carcinoma in situ of cervix, unspecified: Secondary | ICD-10-CM

## 2013-08-05 DIAGNOSIS — R87613 High grade squamous intraepithelial lesion on cytologic smear of cervix (HGSIL): Secondary | ICD-10-CM

## 2013-08-05 DIAGNOSIS — Z3202 Encounter for pregnancy test, result negative: Secondary | ICD-10-CM

## 2013-08-05 LAB — POCT URINE PREGNANCY: PREG TEST UR: NEGATIVE

## 2013-08-05 NOTE — Progress Notes (Signed)
GYNECOLOGY CLINIC LEEP PROCEDURE NOTE   Risks, benefits, alternatives, and limitations of procedure explained to patient, including pain, bleeding, infection, failure to remove abnormal tissue and failure to cure dysplasia, need for repeat procedures, damage to pelvic organs, cervical incompetence.  Role of HPV,cervical dysplasia and need for close followup was empasized. Informed written consent was obtained. All questions were answered. Time out performed.   Procedure: The patient was placed in lithotomy position and the bivalved coated speculum was placed in the patient's vagina. A grounding pad placed on the patient. Lugol's solution was applied to the cervix and areas of decreased uptake were noted around the transformation zone.   Local anesthesia was administered via an intracervical block using 10cc of 2% Lidocaine with epinephrine. The suction was turned on and the Medium 1X Fisher Cone Biopsy Excisor on 50 Watts of cutting current was used to excise the area of decreased uptake and excise the entire transformation zone. Excellent hemostasis was achieved,. Monsel's solution was then applied and the speculum was removed from the vagina. Specimens were sent to pathology.  The patient tolerated the procedure well. Post-operative instructions given to patient, including instruction to seek medical attention for persistent bright red bleeding, fever, abdominal/pelvic pain, dysuria, nausea or vomiting. She was also told about the possibility of having copious yellow to black tinged discharge for weeks. She was counseled to avoid anything in the vagina (sex/douching/tampons) for 3 weeks. She has a 3 week post-operative check to assess wound healing, review results and discuss further management.

## 2013-08-05 NOTE — Patient Instructions (Signed)

## 2013-08-06 ENCOUNTER — Encounter: Payer: Self-pay | Admitting: Obstetrics and Gynecology

## 2013-08-11 ENCOUNTER — Telehealth: Payer: Self-pay | Admitting: Obstetrics and Gynecology

## 2013-08-11 NOTE — Telephone Encounter (Signed)
Pt informed of results , has appt 2/18, will colpo at 3 months.

## 2013-08-18 ENCOUNTER — Telehealth: Payer: Self-pay | Admitting: *Deleted

## 2013-08-18 NOTE — Telephone Encounter (Signed)
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 ------

## 2013-08-21 NOTE — Telephone Encounter (Signed)
Spoke with pt letting her know she will need a colpo, and consideration of deeper leep, after healing from this procedure, in 3 months. Pt states she spoke with Dr. Emelda FearFerguson. Has appt scheduled 08/27/13 to discuss further. JSY

## 2013-08-27 ENCOUNTER — Ambulatory Visit: Payer: Self-pay | Admitting: Obstetrics and Gynecology

## 2013-09-10 ENCOUNTER — Ambulatory Visit (INDEPENDENT_AMBULATORY_CARE_PROVIDER_SITE_OTHER): Payer: Self-pay | Admitting: Obstetrics and Gynecology

## 2013-09-10 ENCOUNTER — Encounter: Payer: Self-pay | Admitting: Obstetrics and Gynecology

## 2013-09-10 ENCOUNTER — Encounter (INDEPENDENT_AMBULATORY_CARE_PROVIDER_SITE_OTHER): Payer: Self-pay

## 2013-09-10 VITALS — BP 112/82 | Ht 65.0 in | Wt 136.0 lb

## 2013-09-10 DIAGNOSIS — Z9889 Other specified postprocedural states: Secondary | ICD-10-CM | POA: Insufficient documentation

## 2013-09-10 NOTE — Progress Notes (Signed)
This chart was scribed by Bennett Scrapehristina Taylor, Medical Scribe, for Dr. Christin BachJohn Esta Carmon on 09/10/13 at 1:55 PM. This chart was reviewed by Dr. Christin BachJohn Katharin Schneider and is accurate.    [facility] Visit  Patient name: Dorothy Strong MRN [id number]  Date of birth: January 05, 1986  CC & HPI:  Dorothy Strong is a 28 y.o. female presenting today for LEEP recheck. 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 - ENDOCERVICAL MARGIN INVOLVED IN THE 12 TO 3 AND 3 T 6 O'CLOCK QUADRANTS. - ECTOCERVICAL MARGIN NOT INVOLVED.  ROS:  No complaints  Pertinent History Reviewed:  Medical & Surgical Hx:  Reviewed: Significant for abnormal PAP  Medications: Reviewed & Updated - see associated section Social History: Reviewed -  reports that she has been smoking Cigarettes.  She has been smoking about 1.00 pack per day. She has never used smokeless tobacco.  Objective Findings:  Vitals: BP 112/82  Ht 5\' 5"  (1.651 m)  Wt 136 lb (61.689 kg)  BMI 22.63 kg/m2 Chaperone present for exam. Exam performed with pt's permission without complications or severe discomfort.  Physical Examination: General appearance - alert, well appearing, and in no distress and oriented to person, place, and time Mental status - alert, oriented to person, place, and time, normal mood, behavior, speech, dress, motor activity, and thought processes Pelvic: cervix is well-healed s/p LEEP, some eversion has occurred.   Assessment & Plan:  A: LEEP: CIN-II-III with extension to inner margin at 9-12 and 12-3 quadrants : well-healing LEEP     P: colposcopy and possible endocervical edge LEEP in 3 months  annual PAPs x 20 yrs..Marland Kitchen

## 2013-10-15 ENCOUNTER — Encounter: Payer: Self-pay | Admitting: Obstetrics and Gynecology

## 2013-10-15 ENCOUNTER — Ambulatory Visit (INDEPENDENT_AMBULATORY_CARE_PROVIDER_SITE_OTHER): Payer: Self-pay | Admitting: Obstetrics and Gynecology

## 2013-10-15 VITALS — BP 140/92 | Ht 65.0 in | Wt 134.0 lb

## 2013-10-15 DIAGNOSIS — N3090 Cystitis, unspecified without hematuria: Secondary | ICD-10-CM | POA: Insufficient documentation

## 2013-10-15 DIAGNOSIS — N926 Irregular menstruation, unspecified: Secondary | ICD-10-CM

## 2013-10-15 DIAGNOSIS — R300 Dysuria: Secondary | ICD-10-CM

## 2013-10-15 DIAGNOSIS — N939 Abnormal uterine and vaginal bleeding, unspecified: Secondary | ICD-10-CM

## 2013-10-15 DIAGNOSIS — R309 Painful micturition, unspecified: Secondary | ICD-10-CM

## 2013-10-15 LAB — POCT URINALYSIS DIPSTICK
GLUCOSE UA: NEGATIVE
Ketones, UA: NEGATIVE
LEUKOCYTES UA: NEGATIVE
NITRITE UA: NEGATIVE
Protein, UA: NEGATIVE
RBC UA: 3

## 2013-10-15 MED ORDER — PHENAZOPYRIDINE HCL 200 MG PO TABS: 200.0000 mg | ORAL_TABLET | Freq: Three times a day (TID) | ORAL | Status: DC | PRN

## 2013-10-15 MED ORDER — MEGESTROL ACETATE 40 MG PO TABS: 40.0000 mg | ORAL_TABLET | Freq: Three times a day (TID) | ORAL | Status: DC

## 2013-10-15 MED ORDER — SULFAMETHOXAZOLE-TMP DS 800-160 MG PO TABS: 1.0000 | ORAL_TABLET | Freq: Two times a day (BID) | ORAL | Status: DC

## 2013-10-15 NOTE — Patient Instructions (Signed)
Dysfunctional Uterine Bleeding Normally, menstrual periods begin between ages 11 to 17 in young women. A normal menstrual cycle/period may begin every 23 days up to 35 days and lasts from 1 to 7 days. Around 12 to 14 days before your menstrual period starts, ovulation (ovary produces an egg) occurs. When counting the time between menstrual periods, count from the first day of bleeding of the previous period to the first day of bleeding of the next period. Dysfunctional (abnormal) uterine bleeding is bleeding that is different from a normal menstrual period. Your periods may come earlier or later than usual. They may be lighter, have blood clots or be heavier. You may have bleeding between periods, or you may skip one period or more. You may have bleeding after sexual intercourse, bleeding after menopause, or no menstrual period. CAUSES   Pregnancy (normal, miscarriage, tubal).  IUDs (intrauterine device, birth control).  Birth control pills.  Hormone treatment.  Menopause.  Infection of the cervix.  Blood clotting problems.  Infection of the inside lining of the uterus.  Endometriosis, inside lining of the uterus growing in the pelvis and other female organs.  Adhesions (scar tissue) inside the uterus.  Obesity or severe weight loss.  Uterine polyps inside the uterus.  Cancer of the vagina, cervix, or uterus.  Ovarian cysts or polycystic ovary syndrome.  Medical problems (diabetes, thyroid disease).  Uterine fibroids (noncancerous tumor).  Problems with your female hormones.  Endometrial hyperplasia, very thick lining and enlarged cells inside of the uterus.  Medicines that interfere with ovulation.  Radiation to the pelvis or abdomen.  Chemotherapy. DIAGNOSIS   Your doctor will discuss the history of your menstrual periods, medicines you are taking, changes in your weight, stress in your life, and any medical problems you may have.  Your doctor will do a physical  and pelvic examination.  Your doctor may want to perform certain tests to make a diagnosis, such as:  Pap test.  Blood tests.  Cultures for infection.  CT scan.  Ultrasound.  Hysteroscopy.  Laparoscopy.  MRI.  Hysterosalpingography.  D and C.  Endometrial biopsy. TREATMENT  Treatment will depend on the cause of the dysfunctional uterine bleeding (DUB). Treatment may include:  Observing your menstrual periods for a couple of months.  Prescribing medicines for medical problems, including:  Antibiotics.  Hormones.  Birth control pills.  Removing an IUD (intrauterine device, birth control).  Surgery:  D and C (scrape and remove tissue from inside the uterus).  Laparoscopy (examine inside the abdomen with a lighted tube).  Uterine ablation (destroy lining of the uterus with electrical current, laser, heat, or freezing).  Hysteroscopy (examine cervix and uterus with a lighted tube).  Hysterectomy (remove the uterus). HOME CARE INSTRUCTIONS   If medicines were prescribed, take exactly as directed. Do not change or switch medicines without consulting your caregiver.  Long term heavy bleeding may result in iron deficiency. Your caregiver may have prescribed iron pills. They help replace the iron that your body lost from heavy bleeding. Take exactly as directed.  Do not take aspirin or medicines that contain aspirin one week before or during your menstrual period. Aspirin may make the bleeding worse.  If you need to change your sanitary pad or tampon more than once every 2 hours, stay in bed with your feet elevated and a cold pack on your lower abdomen. Rest as much as possible, until the bleeding stops or slows down.  Eat well-balanced meals. Eat foods high in iron. Examples   are:  Leafy green vegetables.  Whole-grain breads and cereals.  Eggs.  Meat.  Liver.  Do not try to lose weight until the abnormal bleeding has stopped and your blood iron level is  back to normal. Do not lift more than ten pounds or do strenuous activities when you are bleeding.  For a couple of months, make note on your calendar, marking the start and ending of your period, and the type of bleeding (light, medium, heavy, spotting, clots or missed periods). This is for your caregiver to better evaluate your problem. SEEK MEDICAL CARE IF:   You develop nausea (feeling sick to your stomach) and vomiting, dizziness, or diarrhea while you are taking your medicine.  You are getting lightheaded or weak.  You have any problems that may be related to the medicine you are taking.  You develop pain with your DUB.  You want to remove your IUD.  You want to stop or change your birth control pills or hormones.  You have any type of abnormal bleeding mentioned above.  You are over 43 years old and have not had a menstrual period yet.  You are 28 years old and you are still having menstrual periods.  You have any of the symptoms mentioned above.  You develop a rash. SEEK IMMEDIATE MEDICAL CARE IF:   An oral temperature above 102 F (38.9 C) develops.  You develop chills.  You are changing your sanitary pad or tampon more than once an hour.  You develop abdominal pain.  You pass out or faint. Document Released: 06/23/2000 Document Revised: 09/18/2011 Document Reviewed: 05/25/2009 [facility] Patient Information 2014 [place], [place]. Urinary Tract Infection Urinary tract infections (UTIs) can develop anywhere along your urinary tract. Your urinary tract is your body's drainage system for removing wastes and extra water. Your urinary tract includes two kidneys, two ureters, a bladder, and a urethra. Your kidneys are a pair of bean-shaped organs. Each kidney is about the size of your fist. They are located below your ribs, one on each side of your spine. CAUSES Infections are caused by microbes, which are microscopic organisms, including fungi, viruses, and bacteria. These  organisms are so small that they can only be seen through a microscope. Bacteria are the microbes that most commonly cause UTIs. SYMPTOMS  Symptoms of UTIs may vary by age and gender of the patient and by the location of the infection. Symptoms in young women typically include a frequent and intense urge to urinate and a painful, burning feeling in the bladder or urethra during urination. Older women and men are more likely to be tired, shaky, and weak and have muscle aches and abdominal pain. A fever may mean the infection is in your kidneys. Other symptoms of a kidney infection include pain in your back or sides below the ribs, nausea, and vomiting. DIAGNOSIS To diagnose a UTI, your caregiver will ask you about your symptoms. Your caregiver also will ask to provide a urine sample. The urine sample will be tested for bacteria and white blood cells. White blood cells are made by your body to help fight infection. TREATMENT  Typically, UTIs can be treated with medication. Because most UTIs are caused by a bacterial infection, they usually can be treated with the use of antibiotics. The choice of antibiotic and length of treatment depend on your symptoms and the type of bacteria causing your infection. HOME CARE INSTRUCTIONS  If you were prescribed antibiotics, take them exactly as your caregiver instructs you. [employer]  the medication even if you feel better after you have only taken some of the medication.  Drink enough water and fluids to keep your urine clear or pale yellow.  Avoid caffeine, tea, and carbonated beverages. They tend to irritate your bladder.  Empty your bladder often. Avoid holding urine for long periods of time.  Empty your bladder before and after sexual intercourse.  After a bowel movement, women should cleanse from front to back. Use each tissue only once. SEEK MEDICAL CARE IF:   You have back pain.  You develop a fever.  Your symptoms do not begin to resolve within 3  days. SEEK IMMEDIATE MEDICAL CARE IF:   You have severe back pain or lower abdominal pain.  You develop chills.  You have nausea or vomiting.  You have continued burning or discomfort with urination. MAKE SURE YOU:   Understand these instructions.  Will watch your condition.  Will get help right away if you are not doing well or get worse. Document Released: 04/05/2005 Document Revised: 12/26/2011 Document Reviewed: 08/04/2011 [facility] Patient Information 2014 [place].

## 2013-10-15 NOTE — Progress Notes (Signed)
This chart was scribed by Bennett Scrapehristina Taylor, Medical Scribe, for Dr. Christin BachJohn Tiwana Chavis on 10/15/13 at 4:15 PM. This chart was reviewed by Dr. Christin BachJohn Linford Quintela and is accurate.   [facility] Visit  Patient name: Dorothy Strong MRN [id number]  Date of birth: 1985/12/07  CC & HPI:  Dorothy Saxmily A Doggett is a 28 y.o. female presenting today for UTI symptoms x2 days. Reports right lower back pain, RLQ pain described as pressure and dysuria. Taking Aleve with no relief.  Pt also c/o vaginal bleeding x1 month while on implanon.    ROS:  +dysuria +RLQ pain +right lower back pain No other complaints  Pertinent History Reviewed:  Medical & Surgical Hx:  Reviewed: Significant for colposcopy  Medications: Reviewed & Updated - see associated section Social History: Reviewed -  reports that she has been smoking Cigarettes.  She has been smoking about 1.00 pack per day. She has never used smokeless tobacco.  Objective Findings:  Vitals: BP 140/92  Ht 5\' 5"  (1.651 m)  Wt 134 lb (60.782 kg)  BMI 22.30 kg/m2  LMP 09/17/2013 Chaperone present for exam which was performed with pt's permission Physical Examination: General appearance - alert, well appearing, and in no distress, oriented to person, place, and time and normal appearing weight Mental status - alert, oriented to person, place, and time, normal mood, behavior, speech, dress, motor activity, and thought processes Pelvic - normal external genitalia, vulva, vagina, cervix, uterus and adnexa, tenderness over bladder UA: normal today  Assessment & Plan:  A: 1. Abnormal uterine bleeding on implanon 2. Bladder infection  P: 1. Megace 40 tid til bleeding stops  2. Pyridium 200mg   3 septra

## 2013-10-16 LAB — CULTURE, URINE COMPREHENSIVE
Colony Count: NO GROWTH
ORGANISM ID, BACTERIA: NO GROWTH

## 2013-12-04 ENCOUNTER — Encounter: Payer: Self-pay | Admitting: Obstetrics and Gynecology

## 2013-12-04 ENCOUNTER — Other Ambulatory Visit: Payer: Self-pay | Admitting: Obstetrics and Gynecology

## 2013-12-04 ENCOUNTER — Ambulatory Visit (INDEPENDENT_AMBULATORY_CARE_PROVIDER_SITE_OTHER): Payer: Self-pay | Admitting: Obstetrics and Gynecology

## 2013-12-04 VITALS — BP 120/80 | Ht 65.0 in | Wt 132.0 lb

## 2013-12-04 DIAGNOSIS — Z3202 Encounter for pregnancy test, result negative: Secondary | ICD-10-CM

## 2013-12-04 DIAGNOSIS — O3440 Maternal care for other abnormalities of cervix, unspecified trimester: Secondary | ICD-10-CM

## 2013-12-04 DIAGNOSIS — N870 Mild cervical dysplasia: Secondary | ICD-10-CM

## 2013-12-04 DIAGNOSIS — Z3200 Encounter for pregnancy test, result unknown: Secondary | ICD-10-CM

## 2013-12-04 DIAGNOSIS — D069 Carcinoma in situ of cervix, unspecified: Secondary | ICD-10-CM

## 2013-12-04 DIAGNOSIS — Z9889 Other specified postprocedural states: Principal | ICD-10-CM

## 2013-12-04 DIAGNOSIS — R87619 Unspecified abnormal cytological findings in specimens from cervix uteri: Secondary | ICD-10-CM

## 2013-12-04 LAB — POCT URINE PREGNANCY: Preg Test, Ur: NEGATIVE

## 2013-12-04 NOTE — Progress Notes (Signed)
Patient ID: Dorothy Strong, female   DOB: Mar 17, 1986, 28 y.o.   MRN: [id number] S/p leep with involved margin inner aspect. Now here for colpo, and consideration of further leep or laser. Future childbearing plans undecided.   Dorothy Strong 28 y.o. [id number] here for colposcopy for leep with involved inner margin pap smear on 3/15.  Discussed role for HPV in cervical dysplasia, need for surveillance.  Patient given informed consent, signed copy in the chart, time out was performed.  Placed in lithotomy position. Cervix viewed with speculum and colposcope after application of acetic acid.   Colposcopy adequate? Yes  no visible lesions; biopsies [facility].  Efforts to biopsy cervix unsuccessful due to fibrosis of cervix margin.  ECC specimen obtained.yes All specimens were labelled and sent to pathology.  Colposcopy IMPRESSION: No visible dysplasia Patient was given post procedure instructions. Will follow up pathology and manage accordingly.  Routine preventative health maintenance measures emphasized.  Plan : pap c cotesting at 6 and 12 months.

## 2013-12-04 NOTE — Patient Instructions (Signed)
We will perform repeat pap at 6 and 12 months from today. todays ECC scraping results back in 1 week

## 2013-12-05 ENCOUNTER — Telehealth: Payer: Self-pay | Admitting: *Deleted

## 2013-12-05 NOTE — Telephone Encounter (Signed)
Pt states had colposcopy with ECC yesterday in our office with Dr. Emelda Fear can she go swimming in a pool. Pt informed per Cyril Mourning, NP ok to go swimming.

## 2013-12-11 ENCOUNTER — Ambulatory Visit: Payer: Self-pay | Admitting: Obstetrics and Gynecology

## 2013-12-16 ENCOUNTER — Telehealth: Payer: Self-pay | Admitting: *Deleted

## 2013-12-16 NOTE — Telephone Encounter (Signed)
Message copied by Criss Alvine on Tue Dec 16, 2013  9:55 AM ------      Message from: Tilda Burrow      Created: Sun Dec 14, 2013  1:07 PM       ECC positive for high grade lesion as well as low grade features, will need appt to plan excision by LEEP or CKC ------

## 2013-12-29 ENCOUNTER — Telehealth: Payer: Self-pay | Admitting: *Deleted

## 2014-01-16 ENCOUNTER — Ambulatory Visit (INDEPENDENT_AMBULATORY_CARE_PROVIDER_SITE_OTHER): Payer: Medicaid Other | Admitting: Obstetrics and Gynecology

## 2014-01-16 ENCOUNTER — Encounter: Payer: Self-pay | Admitting: Obstetrics and Gynecology

## 2014-01-16 VITALS — BP 110/60 | Ht 65.0 in | Wt 131.0 lb

## 2014-01-16 DIAGNOSIS — N870 Mild cervical dysplasia: Secondary | ICD-10-CM

## 2014-01-16 DIAGNOSIS — N926 Irregular menstruation, unspecified: Secondary | ICD-10-CM

## 2014-01-16 DIAGNOSIS — N939 Abnormal uterine and vaginal bleeding, unspecified: Secondary | ICD-10-CM

## 2014-01-16 DIAGNOSIS — N871 Moderate cervical dysplasia: Secondary | ICD-10-CM | POA: Insufficient documentation

## 2014-01-16 NOTE — Progress Notes (Signed)
Patient ID: Dorothy Strong, female   DOB: Jan 28, 1986, 28 y.o.   MRN: [id number]  This chart was scribed by Jarvis Morganaylor Haila Dena, Medical Scribe, for Dr. Christin BachJohn Carreen Milius on 01/16/14 at 10:56 AM. This chart was reviewed by Dr. Christin BachJohn Waylynn Benefiel for accuracy.    Dorothy Strong is a 28 y.o. G1P1001 with No LMP recorded. Patient has had an implant.   Patient is having complaints abnormal bleeding. She states she is taking Megace for the bleeding but states she is having cramps. [facility]  PMH:    Past Medical History  Diagnosis Date  . Condyloma acuminatum   . Depression   . Abnormal Pap smear of cervix     PSH:     Past Surgical History  Procedure Laterality Date  . Tonsillectomy    . Adenoidectomy    . Colposcopy      POb/GynH:      OB History   Grav Para Term Preterm Abortions TAB SAB Ect Mult Living   1 1 1       1       SH:   History  Substance Use Topics  . Smoking status: Current Every Day Smoker -- 1.00 packs/day for 8 years    Types: Cigarettes  . Smokeless tobacco: Never Used  . Alcohol Use: No    FH:    Family History  Problem Relation Age of Onset  . Cancer Other     prostate   . Diabetes Other   . Arthritis Other   . Hypertension Other   . Hypertension Father      Allergies: No Known Allergies  Medications:      Current outpatient prescriptions:Etonogestrel (IMPLANON Cameron), Inject into the skin.  , Disp: , Rfl: ;  megestrol (MEGACE) 40 MG tablet, Take 1 tablet (40 mg total) by mouth 3 (three) times daily. Til bleeding stops, Disp: 45 tablet, Rfl: 1  Review of Systems:   Review of Systems  Constitutional: Negative for fever, chills, weight loss, malaise/fatigue and diaphoresis.  HENT: Negative for hearing loss, ear pain, nosebleeds, congestion, sore throat, neck pain, tinnitus and ear discharge.   Eyes: Negative for blurred vision, double vision, photophobia, pain, discharge and redness.  Respiratory: Negative for cough, hemoptysis, sputum production,  shortness of breath, wheezing and stridor.   Cardiovascular: Negative for chest pain, palpitations, orthopnea, claudication, leg swelling and PND.  Gastrointestinal:  Negative for abdominal pain, heartburn, nausea, vomiting, diarrhea, constipation, blood in stool and melena.  Genitourinary: Negative for dysuria, urgency, frequency, hematuria and flank pain. Positive for menorrhagia Musculoskeletal: Negative for myalgias, back pain, joint pain and falls.  Skin: Negative for itching and rash.  Neurological: Negative for dizziness, tingling, tremors, sensory change, speech change, focal weakness, seizures, loss of consciousness, weakness and headaches.  Endo/Heme/Allergies: Negative for environmental allergies and polydipsia. Does not bruise/bleed easily.  Psychiatric/Behavioral: Negative for depression, suicidal ideas, hallucinations, memory loss and substance abuse. The patient is not nervous/anxious and does not have insomnia.      PHYSICAL EXAM:  Blood pressure 110/60, height 5\' 5"  (1.651 m), weight 131 lb (59.421 kg).    Vitals reviewed. Constitutional: She is oriented to person, place, and time. She appears well-developed and well-nourished.  HENT:  Head: Normocephalic and atraumatic.  Right Ear: External ear normal.  Left Ear: External ear normal.  Nose: Nose normal.  Mouth/Throat: Oropharynx is clear and moist.  Eyes: Conjunctivae and EOM are normal. Pupils are equal, round, and reactive to light. Right  eye exhibits no discharge. Left eye exhibits no discharge. No scleral icterus.  Neck: Normal range of motion. Neck supple. No tracheal deviation present. No thyromegaly present.  Cardiovascular: Normal rate, regular rhythm, normal heart sounds and intact distal pulses.  Exam reveals no gallop and no friction rub.   No murmur heard. Respiratory: Effort normal and breath sounds normal. No respiratory distress. She has no wheezes. She has no rales. She exhibits no tenderness.  GI:  Soft. Bowel sounds are normal. She exhibits no distension and no mass.  There is no rebound and no guarding.  Musculoskeletal: Normal range of motion. She exhibits no edema and no tenderness.  Neurological: She is alert and oriented to person, place, and time. She has normal reflexes. She displays normal reflexes. No cranial nerve deficit. She exhibits normal muscle tone. Coordination normal.  Skin: Skin is warm and dry. No rash noted. No erythema. No pallor.  Psychiatric: She has a normal mood and affect. Her behavior is normal. Judgment and thought content normal.    Labs: No results found for this or any previous visit (from the past 336 hour(s)).  EKG: No orders found for this or any previous visit.  Imaging Studies: No results found.    Assessment: Patient Active Problem List   Diagnosis Date Noted  . Abnormal uterine bleeding 10/15/2013  . Bladder infection 10/15/2013  . S/P LEEP 09/10/2013  . Condyloma acuminatum 07/21/2013  . Abnormal finding on Pap smear 07/21/2013  high grade endocervical abnormality on sample on ECC  Plan: Stop taking Megace for a week. Discussed plans with patient to have a conization of cervix with biopsy. Will schedule and arrange pre-op visit  Marykathryn Carboni V 01/16/2014 10:52 AM

## 2014-01-19 ENCOUNTER — Telehealth: Payer: Self-pay | Admitting: *Deleted

## 2014-01-19 NOTE — Telephone Encounter (Signed)
Message copied by Criss AlvinePULLIAM, Noemie Devivo G on Mon Jan 19, 2014  9:24 AM ------      Message from: Tilda BurrowFERGUSON, JOHN V      Created: Sun Dec 14, 2013  1:07 PM       ECC positive for high grade lesion as well as low grade features, will need appt to plan excision by LEEP or CKC ------

## 2014-01-19 NOTE — Telephone Encounter (Signed)
Attempted to contact patient x 3, Pt has an appt with Dr. Emelda FearFerguson on 02/04/2014.

## 2014-01-30 ENCOUNTER — Encounter (HOSPITAL_COMMUNITY): Payer: Self-pay | Admitting: Pharmacy Technician

## 2014-02-04 ENCOUNTER — Ambulatory Visit (INDEPENDENT_AMBULATORY_CARE_PROVIDER_SITE_OTHER): Payer: Medicaid Other | Admitting: Obstetrics and Gynecology

## 2014-02-04 ENCOUNTER — Encounter: Payer: Self-pay | Admitting: Obstetrics and Gynecology

## 2014-02-04 VITALS — BP 100/60 | Ht 65.0 in | Wt 126.0 lb

## 2014-02-04 DIAGNOSIS — Z01818 Encounter for other preprocedural examination: Secondary | ICD-10-CM

## 2014-02-04 DIAGNOSIS — N871 Moderate cervical dysplasia: Secondary | ICD-10-CM

## 2014-02-04 MED ORDER — METRONIDAZOLE 500 MG PO TABS: 500.0000 mg | ORAL_TABLET | Freq: Two times a day (BID) | ORAL | Status: DC

## 2014-02-04 NOTE — Addendum Note (Signed)
Addended by: Richardson ChiquitoRAVIS, Charis Juliana M on: 02/04/2014 02:12 PM   Modules accepted: Orders

## 2014-02-04 NOTE — Patient Instructions (Signed)
Bacterial Vaginosis Bacterial vaginosis is an infection of the vagina. It happens when too many of certain germs (bacteria) grow in the vagina. HOME CARE  Take your medicine as told by your doctor.  Finish your medicine even if you start to feel better.  Do not have sex until you finish your medicine and are better.  Tell your sex partner that you have an infection. They should see their doctor for treatment.  Practice safe sex. Use condoms. Have only one sex partner. GET HELP IF:  You are not getting better after 3 days of treatment.  You have more grey fluid (discharge) coming from your vagina than before.  You have more pain than before.  You have a fever. MAKE SURE YOU:   Understand these instructions.  Will watch your condition.  Will get help right away if you are not doing well or get worse. Document Released: 04/04/2008 Document Revised: 04/16/2013 Document Reviewed: 02/05/2013 ExitCare Patient Information 2015 [employer]. This information is not intended to replace advice given to you by your health care provider. Make sure you discuss any questions you have with your health care provider.  

## 2014-02-04 NOTE — Progress Notes (Signed)
This chart was scribed by Leone PayorSonum Patel, Medical Scribe, for Dr. Christin BachJohn Carolle Ishii on 02/04/14 at 1:58 PM. This chart was reviewed by Dr. Christin BachJohn Cannon Quinton for accuracy.    Preoperative History and Physical  Dorothy Strong is a 28 y.o. 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   Proposed surgery: Conization of cervix   Past Medical History  Diagnosis Date  . Condyloma acuminatum   . Depression   . Abnormal Pap smear of cervix    Past Surgical History  Procedure Laterality Date  . Tonsillectomy    . Adenoidectomy    . Colposcopy     OB History   Grav Para Term Preterm Abortions TAB SAB Ect Mult Living   1 1 1       1      Patient denies any cervical dysplasia or STIs.  (Not in a hospital admission)  No Known Allergies Social History:   reports that she has been smoking Cigarettes.  She has a 8 pack-year smoking history. She has never used smokeless tobacco. She reports that she does not drink alcohol or use illicit drugs. Family History  Problem Relation Age of Onset  . Cancer Other     prostate   . Diabetes Other   . Arthritis Other   . Hypertension Other   . Hypertension Father     Review of Systems: Noncontributory  PHYSICAL EXAM: Blood pressure 100/60, height 5\' 5"  (1.651 m), weight 126 lb (57.153 kg). General appearance - alert, well appearing, and in no distress Chest - clear to auscultation, no wheezes, rales or rhonchi, symmetric air entry Heent: palate visualized, high palate, intact. Good airway access suspected Heart - normal rate and regular rhythm Abdomen - soft, nontender, nondistended, no masses or organomegaly Pelvic - VULVA: normal appearing vulva with no masses, tenderness or lesions, VAGINA: Moderate vaginal discharge, +whiff test, CERVIX: normal appearing cervix without discharge or lesions, UTERUS: uterus is normal size, shape, consistency and nontender, ADNEXA: normal adnexa in size, nontender and no  masses.  Extremities - peripheral pulses normal, no pedal edema, no clubbing or cyanosis  Labs: No results found for this or any previous visit (from the past 336 hour(s)).  Imaging Studies: No results found.  Assessment: Cervical dysplasia with high grade endocervical involvement.  BV   Patient Active Problem List   Diagnosis Date Noted  . Dysplasia of cervix, low grade (CIN 1) 01/16/2014  . Abnormal uterine bleeding 10/15/2013  . Bladder infection 10/15/2013  . S/P LEEP 09/10/2013  . Condyloma acuminatum 07/21/2013  . Abnormal finding on Pap smear 07/21/2013    Plan: 1. Patient will undergo surgical management with conization of cervix.   CKC scheduled for 02/17/14 2.  Bacterial vaginosis, Rx metronidazole today bid x 7d

## 2014-02-05 LAB — GC/CHLAMYDIA PROBE AMP
CT Probe RNA: NEGATIVE
GC PROBE AMP APTIMA: NEGATIVE

## 2014-02-06 ENCOUNTER — Other Ambulatory Visit: Payer: Self-pay | Admitting: Obstetrics and Gynecology

## 2014-02-06 NOTE — H&P (Signed)
  Preoperative History and Physical  Dorothy Strong is a 28 y.o. 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  Proposed surgery: Conization of cervix  Past Medical History   Diagnosis  Date   .  Condyloma acuminatum    .  Depression    .  Abnormal Pap smear of cervix     Past Surgical History   Procedure  Laterality  Date   .  Tonsillectomy     .  Adenoidectomy     .  Colposcopy      OB History    Grav  Para  Term  Preterm  Abortions  TAB  SAB  Ect  Mult  Living    1  1  1        1     Patient denies any cervical dysplasia or STIs.  (Not in a hospital admission)  No Known Allergies  Social History: reports that she has been smoking Cigarettes. She has a 8 pack-year smoking history. She has never used smokeless tobacco. She reports that she does not drink alcohol or use illicit drugs.  Family History   Problem  Relation  Age of Onset   .  Cancer  Other      prostate   .  Diabetes  Other    .  Arthritis  Other    .  Hypertension  Other    .  Hypertension  Father    Review of Systems: Noncontributory  PHYSICAL EXAM:  Blood pressure 100/60, height 5' 5" (1.651 m), weight 126 lb (57.153 kg).  General appearance - alert, well appearing, and in no distress  Chest - clear to auscultation, no wheezes, rales or rhonchi, symmetric air entry  Heent: palate visualized, high palate, intact. Good airway access suspected  Heart - normal rate and regular rhythm  Abdomen - soft, nontender, nondistended, no masses or organomegaly  Pelvic - VULVA: normal appearing vulva with no masses, tenderness or lesions, VAGINA: Moderate vaginal discharge, +whiff test, CERVIX: normal appearing cervix without discharge or lesions, UTERUS: uterus is normal size, shape, consistency and nontender, ADNEXA: normal adnexa in size, nontender and no masses.  Extremities - peripheral pulses normal, no pedal edema, no clubbing or cyanosis  Labs:  No results found  for this or any previous visit (from the past 336 hour(s)).  Imaging Studies:  No results found.  Assessment:  Cervical dysplasia with high grade endocervical involvement.  BV  Patient Active Problem List    Diagnosis  Date Noted   .  Dysplasia of cervix, low grade (CIN 1)  01/16/2014   .  Abnormal uterine bleeding  10/15/2013   .  Bladder infection  10/15/2013   .  S/P LEEP  09/10/2013   .  Condyloma acuminatum  07/21/2013   .  Abnormal finding on Pap smear  07/21/2013   Plan:  1. Patient will undergo surgical management with conization of cervix.  CKC scheduled for 02/17/14  2. Bacterial vaginosis, Rx metronidazole today bid x 7d  

## 2014-02-11 NOTE — Patient Instructions (Signed)
Mliss Saxmily A Dambrosia  02/11/2014   Your procedure is scheduled on:  02/17/2014  [facility]  [facility]  [facility].  Call this number if you have problems the morning of surgery: [phone]   Remember:   Do not eat food or drink liquids after midnight.   Take these medicines the morning of surgery with A SIP OF WATER: none   Do not wear jewelry, make-up or nail polish.  Do not wear lotions, powders, or perfumes.   Do not shave 48 hours prior to surgery. Men may shave face and neck.  Do not bring valuables to the hospital.  [facility] is not responsible for any belongings or valuables.               Contacts, dentures or bridgework may not be worn into surgery.  Leave suitcase in the car. After surgery it may be brought to your room.  For patients admitted to the hospital, discharge time is determined by your treatment team.               Patients discharged the day of surgery will not be allowed to drive home.  Name and phone number of your driver: family  Special Instructions: Shower using CHG 2 nights before surgery and the night before surgery.  If you shower the day of surgery use CHG.  Use special wash - you have one bottle of CHG for all showers.  You should use approximately 1/3 of the bottle for each shower.   Please read over the following fact sheets that you were given: Pain Booklet, Coughing and Deep Breathing, Surgical Site Infection Prevention, Anesthesia Post-op Instructions and Care and Recovery After Surgery Conization of the Cervix Cervical conization is the cutting (excision) of a cone-shaped portion of the cervix. The procedure is performed through the vagina in either your health care provider's office or an operating room. This procedure is usually done when there is abnormal bleeding from the cervix. It can also be done to evaluate an abnormal Pap test or if an abnormality is seen on the cervix during an exam. The tissue is then examined to see if there are  precancerous cells or cancer present.  Conization of the cervix is not done during a menstrual period or pregnancy.  LET [facility] PROVIDER KNOW ABOUT:  Any allergies you have.   All medicines you are taking, including vitamins, herbs, eye drops, creams, and over-the-counter medicines.   Previous problems you or members of your family have had with the use of anesthetics.   Any blood disorders you have.   Previous surgeries you have had.   Medical conditions you have.   Your smoking habits.   The possibility of being pregnant.  RISKS AND COMPLICATIONS  Generally, conization of the cervix is a safe procedure. However, as with any procedure, complications can occur. Possible complications include:  Heavy bleeding several days or weeks after the procedure. Light bleeding or spotting after the procedure is normal.  Infection (rare).  Damage to the cervix or surrounding organs (uncommon).   Problems with the anesthesia.   Increased risk of preterm labor in future pregnancies. BEFORE THE PROCEDURE  Do not eat or drink anything for 6-8 hours before the procedure.   Do not take aspirin or blood thinners for at least a week before the procedure or as directed by your health care provider.   Arrange for someone to take you home after the procedure.  PROCEDURE There are three different methods to perform conization of the cervix. These include:   The cold knife method. In this method a small cone-shaped sample of tissue is cut out with a knife (scalpel) from the cervical canal and the transformation zone (where the normal cells end and the abnormal cells begin).   The LEEP method. In this method a small cone-shaped sample of tissue is cut out with a thin wire that can burn (cauterize) the cervical tissue with an electrical current.   Laser treatment. In this method a small cone-shaped sample of tissue is cut out and then cauterized with a laser beam to prevent  bleeding.  The procedure will be performed as follows:   Depending on the method, you will either be given a medicine to make you sleep (general anesthetic) or a numbing medicine (local anesthetic). A medicine that numbs the cervix (cervical block) may be given.   A lubricated device called a speculum will be inserted into the vagina to spread open the walls of the vagina. This will help your health care provider see the inside of the vagina and cervix better.   The tissue from the cervix will be removed and examined.   The results of the procedure will help your health care provider decide if further treatment is necessary. They will also help your health care provider decide on the best treatment if your results are abnormal. AFTER THE PROCEDURE  If you had a general anesthetic, you may be groggy for 2-3 hours after the procedure.   If you had a local anesthetic, you will rest at the clinic or hospital until you are stable and feel ready to go home.   Recovery may take up to 3 weeks.   You may have some cramping for about 1 week.   You may have bloody discharge or light bleeding for 1-2 weeks.   You may have black discharge coming from the vagina. This is from the paste used on the cervix to prevent bleeding. This is normal discharge.  Document Released: 04/05/2005 Document Revised: 07/01/2013 Document Reviewed: 12/20/2012 [facility] Patient Information 2015 [place]. This information is not intended to replace advice given to you by your health care provider. Make sure you discuss any questions you have with your health care provider. PATIENT INSTRUCTIONS POST-ANESTHESIA  IMMEDIATELY FOLLOWING SURGERY:  Do not drive or operate machinery for the first twenty four hours after surgery.  Do not make any important decisions for twenty four hours after surgery or while taking narcotic pain medications or sedatives.  If you develop intractable nausea and vomiting or a severe  headache please notify your doctor immediately.  FOLLOW-UP:  Please make an appointment with your surgeon as instructed. You do not need to follow up with anesthesia unless specifically instructed to do so.  WOUND CARE INSTRUCTIONS (if applicable):  Keep a dry clean dressing on the anesthesia/puncture wound site if there is drainage.  Once the wound has quit draining you may leave it open to air.  Generally you should leave the bandage intact for twenty four hours unless there is drainage.  If the epidural site drains for more than 36-48 hours please call the anesthesia department.  QUESTIONS?:  Please feel free to call your physician or the hospital operator if you have any questions, and they will be happy to assist you.

## 2014-02-12 ENCOUNTER — Encounter (HOSPITAL_COMMUNITY): Payer: Self-pay

## 2014-02-12 ENCOUNTER — Encounter (HOSPITAL_COMMUNITY)
Admission: RE | Admit: 2014-02-12 | Discharge: 2014-02-12 | Disposition: A | Discharge status: Home / Self Care | Payer: Medicaid Other | Source: Ambulatory Visit | Attending: Obstetrics and Gynecology | Admitting: Obstetrics and Gynecology

## 2014-02-12 DIAGNOSIS — Z01812 Encounter for preprocedural laboratory examination: Secondary | ICD-10-CM | POA: Insufficient documentation

## 2014-02-12 DIAGNOSIS — Z01818 Encounter for other preprocedural examination: Secondary | ICD-10-CM | POA: Diagnosis not present

## 2014-02-12 LAB — URINALYSIS, ROUTINE W REFLEX MICROSCOPIC
Bilirubin Urine: NEGATIVE
Glucose, UA: NEGATIVE mg/dL
Hgb urine dipstick: NEGATIVE
Ketones, ur: NEGATIVE mg/dL
Leukocytes, UA: NEGATIVE
NITRITE: NEGATIVE
Protein, ur: NEGATIVE mg/dL
UROBILINOGEN UA: 0.2 mg/dL (ref 0.0–1.0)
pH: 7 (ref 5.0–8.0)

## 2014-02-12 LAB — CBC
HEMATOCRIT: 41.5 % (ref 36.0–46.0)
Hemoglobin: 14.5 g/dL (ref 12.0–15.0)
MCH: 31.7 pg (ref 26.0–34.0)
MCHC: 34.9 g/dL (ref 30.0–36.0)
MCV: 90.6 fL (ref 78.0–100.0)
PLATELETS: 172 10*3/uL (ref 150–400)
RBC: 4.58 MIL/uL (ref 3.87–5.11)
RDW: 12.9 % (ref 11.5–15.5)
WBC: 11.3 10*3/uL — AB (ref 4.0–10.5)

## 2014-02-12 NOTE — Telephone Encounter (Signed)
Pt has procedure 08/11, post op appt scheduled for 0819/2015.

## 2014-02-12 NOTE — Telephone Encounter (Signed)
Pt has an appt for 02/25/2014 with Dr. Emelda FearFerguson.

## 2014-02-17 ENCOUNTER — Ambulatory Visit (HOSPITAL_COMMUNITY)
Admission: RE | Admit: 2014-02-17 | Discharge: 2014-02-17 | Disposition: A | Discharge status: Home / Self Care | Payer: Medicaid Other | Source: Ambulatory Visit | Attending: Obstetrics and Gynecology | Admitting: Obstetrics and Gynecology

## 2014-02-17 ENCOUNTER — Encounter (HOSPITAL_COMMUNITY): Payer: Medicaid Other | Admitting: Anesthesiology

## 2014-02-17 ENCOUNTER — Ambulatory Visit (HOSPITAL_COMMUNITY): Payer: Medicaid Other | Admitting: Anesthesiology

## 2014-02-17 ENCOUNTER — Encounter (HOSPITAL_COMMUNITY)
Admission: RE | Disposition: A | Discharge status: Home / Self Care | Payer: Self-pay | Source: Ambulatory Visit | Attending: Obstetrics and Gynecology

## 2014-02-17 DIAGNOSIS — D069 Carcinoma in situ of cervix, unspecified: Secondary | ICD-10-CM

## 2014-02-17 DIAGNOSIS — N871 Moderate cervical dysplasia: Secondary | ICD-10-CM

## 2014-02-17 DIAGNOSIS — G500 Trigeminal neuralgia: Secondary | ICD-10-CM | POA: Diagnosis not present

## 2014-02-17 DIAGNOSIS — A499 Bacterial infection, unspecified: Secondary | ICD-10-CM | POA: Diagnosis not present

## 2014-02-17 DIAGNOSIS — B9689 Other specified bacterial agents as the cause of diseases classified elsewhere: Secondary | ICD-10-CM | POA: Insufficient documentation

## 2014-02-17 DIAGNOSIS — N760 Acute vaginitis: Secondary | ICD-10-CM | POA: Diagnosis not present

## 2014-02-17 HISTORY — PX: CERVICAL CONIZATION W/BX: SHX1330

## 2014-02-17 LAB — PREGNANCY, URINE: Preg Test, Ur: NEGATIVE

## 2014-02-17 SURGERY — CONE BIOPSY, CERVIX
Anesthesia: General

## 2014-02-17 MED ORDER — FENTANYL CITRATE 0.05 MG/ML IJ SOLN
INTRAMUSCULAR | Status: AC
  Filled 2014-02-17: qty 2

## 2014-02-17 MED ORDER — FERRIC SUBSULFATE 259 MG/GM EX SOLN
CUTANEOUS | Status: DC | PRN
  Administered 2014-02-17: 1 via TOPICAL

## 2014-02-17 MED ORDER — ONDANSETRON HCL 4 MG/2ML IJ SOLN
INTRAMUSCULAR | Status: AC
  Filled 2014-02-17: qty 2

## 2014-02-17 MED ORDER — KETOROLAC TROMETHAMINE 10 MG PO TABS: 10.0000 mg | ORAL_TABLET | Freq: Four times a day (QID) | ORAL | Status: DC | PRN

## 2014-02-17 MED ORDER — FENTANYL CITRATE 0.05 MG/ML IJ SOLN
100.0000 ug | Freq: Once | INTRAMUSCULAR | Status: AC
  Administered 2014-02-17: 100 ug via INTRAVENOUS

## 2014-02-17 MED ORDER — 0.9 % SODIUM CHLORIDE (POUR BTL) OPTIME
TOPICAL | Status: DC | PRN
  Administered 2014-02-17: 1000 mL

## 2014-02-17 MED ORDER — MIDAZOLAM HCL 2 MG/2ML IJ SOLN
INTRAMUSCULAR | Status: AC
  Filled 2014-02-17: qty 2

## 2014-02-17 MED ORDER — LACTATED RINGERS IV SOLN
INTRAVENOUS | Status: DC
  Administered 2014-02-17: 1000 mL via INTRAVENOUS
  Administered 2014-02-17: 10:00:00 via INTRAVENOUS

## 2014-02-17 MED ORDER — KETOROLAC TROMETHAMINE 30 MG/ML IJ SOLN
30.0000 mg | Freq: Once | INTRAMUSCULAR | Status: AC
  Administered 2014-02-17: 30 mg via INTRAVENOUS
  Filled 2014-02-17: qty 1

## 2014-02-17 MED ORDER — FENTANYL CITRATE 0.05 MG/ML IJ SOLN
25.0000 ug | INTRAMUSCULAR | Status: DC | PRN
  Administered 2014-02-17: 50 ug via INTRAVENOUS
  Filled 2014-02-17: qty 2

## 2014-02-17 MED ORDER — LIDOCAINE HCL 1 % IJ SOLN
INTRAMUSCULAR | Status: DC | PRN
  Administered 2014-02-17: 50 mg via INTRADERMAL

## 2014-02-17 MED ORDER — PROPOFOL 10 MG/ML IV BOLUS
INTRAVENOUS | Status: AC
  Filled 2014-02-17: qty 20

## 2014-02-17 MED ORDER — PROPOFOL 10 MG/ML IV BOLUS
INTRAVENOUS | Status: DC | PRN
  Administered 2014-02-17: 170 mg via INTRAVENOUS

## 2014-02-17 MED ORDER — FERRIC SUBSULFATE 259 MG/GM EX SOLN
CUTANEOUS | Status: AC
  Filled 2014-02-17: qty 8

## 2014-02-17 MED ORDER — HYDROCODONE-ACETAMINOPHEN 5-325 MG PO TABS: 1.0000 | ORAL_TABLET | Freq: Four times a day (QID) | ORAL | Status: DC | PRN

## 2014-02-17 MED ORDER — FENTANYL CITRATE 0.05 MG/ML IJ SOLN
INTRAMUSCULAR | Status: DC | PRN
  Administered 2014-02-17 (×2): 50 ug via INTRAVENOUS

## 2014-02-17 MED ORDER — ONDANSETRON HCL 4 MG/2ML IJ SOLN: 4.0000 mg | Freq: Once | INTRAMUSCULAR | Status: DC | PRN

## 2014-02-17 MED ORDER — MIDAZOLAM HCL 5 MG/5ML IJ SOLN
INTRAMUSCULAR | Status: DC | PRN
  Administered 2014-02-17: 2 mg via INTRAVENOUS

## 2014-02-17 MED ORDER — LIDOCAINE HCL (PF) 1 % IJ SOLN
INTRAMUSCULAR | Status: AC
  Filled 2014-02-17: qty 5

## 2014-02-17 MED ORDER — MIDAZOLAM HCL 2 MG/2ML IJ SOLN
1.0000 mg | INTRAMUSCULAR | Status: DC | PRN
  Administered 2014-02-17 (×2): 2 mg via INTRAVENOUS
  Filled 2014-02-17: qty 2

## 2014-02-17 MED ORDER — FENTANYL CITRATE 0.05 MG/ML IJ SOLN
25.0000 ug | INTRAMUSCULAR | Status: AC
  Administered 2014-02-17 (×2): 25 ug via INTRAVENOUS

## 2014-02-17 MED ORDER — BUPIVACAINE-EPINEPHRINE 0.5% -1:200000 IJ SOLN
INTRAMUSCULAR | Status: DC | PRN
  Administered 2014-02-17: 9 mL

## 2014-02-17 MED ORDER — BUPIVACAINE-EPINEPHRINE (PF) 0.5% -1:200000 IJ SOLN
INTRAMUSCULAR | Status: AC
  Filled 2014-02-17: qty 30

## 2014-02-17 MED ORDER — ONDANSETRON HCL 4 MG/2ML IJ SOLN
4.0000 mg | Freq: Once | INTRAMUSCULAR | Status: AC
  Administered 2014-02-17: 4 mg via INTRAVENOUS

## 2014-02-17 MED ORDER — LIDOCAINE-EPINEPHRINE (PF) 1 %-1:200000 IJ SOLN
INTRAMUSCULAR | Status: AC
  Filled 2014-02-17: qty 10

## 2014-02-17 SURGICAL SUPPLY — 22 items
BAG HAMPER (MISCELLANEOUS) ×3 IMPLANT
BLADE SURG 15 STRL LF DISP TIS (BLADE) ×1 IMPLANT
BLADE SURG 15 STRL SS (BLADE) ×2
CATH ROBINSON RED A/P 16FR (CATHETERS) IMPLANT
CLOTH BEACON ORANGE TIMEOUT ST (SAFETY) ×3 IMPLANT
COVER LIGHT HANDLE STERIS (MISCELLANEOUS) ×6 IMPLANT
DRAPE PROXIMA HALF (DRAPES) ×3 IMPLANT
ELECT REM PT RETURN 9FT ADLT (ELECTROSURGICAL) ×3
ELECTRODE REM PT RTRN 9FT ADLT (ELECTROSURGICAL) ×1 IMPLANT
FORMALIN 10 PREFIL 120ML (MISCELLANEOUS) ×3 IMPLANT
GLOVE BIOGEL PI IND STRL 9 (GLOVE) ×1 IMPLANT
GLOVE BIOGEL PI INDICATOR 9 (GLOVE) ×2
GLOVE ECLIPSE 9.0 STRL (GLOVE) ×3 IMPLANT
GOWN SPEC L3 XXLG W/TWL (GOWN DISPOSABLE) ×3 IMPLANT
GOWN STRL REUS W/TWL LRG LVL3 (GOWN DISPOSABLE) ×3 IMPLANT
KIT ROOM TURNOVER AP CYSTO (KITS) ×3 IMPLANT
MANIFOLD NEPTUNE II (INSTRUMENTS) ×3 IMPLANT
PACK PERI GYN (CUSTOM PROCEDURE TRAY) ×3 IMPLANT
PAD ARMBOARD 7.5X6 YLW CONV (MISCELLANEOUS) ×3 IMPLANT
SET BASIN LINEN APH (SET/KITS/TRAYS/PACK) ×3 IMPLANT
SUT CHROMIC 2 0 CT 1 (SUTURE) ×9 IMPLANT
SYRINGE CONTROL L 12CC (SYRINGE) ×3 IMPLANT

## 2014-02-17 NOTE — Anesthesia Postprocedure Evaluation (Signed)
  Anesthesia Post-op Note  Patient: Dorothy Strong  Procedure(s) Performed: Procedure(s): CONIZATION CERVIX WITH BIOPSY (N/A)  Patient Location: PACU  Anesthesia Type:General  Level of Consciousness: awake, alert , oriented and patient cooperative  Airway and Oxygen Therapy: Patient Spontanous Breathing  Post-op Pain: 3 /10, mild  Post-op Assessment: Post-op Vital signs reviewed, Patient's Cardiovascular Status Stable, Respiratory Function Stable, Patent Airway, No signs of Nausea or vomiting and Pain level controlled  Post-op Vital Signs: Reviewed and stable  Last Vitals:  Filed Vitals:   02/17/14 0945  BP:   Temp:   Resp: 19    Complications: No apparent anesthesia complications

## 2014-02-17 NOTE — Brief Op Note (Signed)
02/17/2014  11:10 AM  PATIENT:  Dorothy Strong  28 y.o. female  PRE-OPERATIVE DIAGNOSIS: cervical dysplasia CIN -II-III  POST-OPERATIVE DIAGNOSIS:   cervical dysplasia CIN -II-III   PROCEDURE:  Procedure(s): CONIZATION CERVIX WITH BIOPSY (N/A)  SURGEON:  Surgeon(s) and Role:    * Tilda BurrowJohn Lucyle Alumbaugh V, MD - Primary  PHYSICIAN ASSISTANT:   ASSISTANTS: [place] CST   ANESTHESIA:   local and general  EBL:  Total I/O In: 1300 [I.V.:1300] Out: -   BLOOD ADMINISTERED:none  DRAINS: none   LOCAL MEDICATIONS USED:  MARCAINE    and Amount: 10 ml  SPECIMEN:  Source of Specimen:  cervical cone, tagged at 12 oclock  DISPOSITION OF SPECIMEN:  PATHOLOGY  COUNTS:  YES  TOURNIQUET:  * No tourniquets in log *  DICTATION: .Dragon Dictation  PLAN OF CARE: Discharge to home after PACU  PATIENT DISPOSITION:  PACU - hemodynamically stable.   Delay start of Pharmacological VTE agent (>24hrs) due to surgical blood loss or risk of bleeding: not applicable

## 2014-02-17 NOTE — Op Note (Signed)
02/17/2014  11:10 AM  PATIENT:  Dorothy Strong  28 y.o. female  PRE-OPERATIVE DIAGNOSIS: cervical dysplasia CIN -II-III  POST-OPERATIVE DIAGNOSIS:   cervical dysplasia CIN -II-III   PROCEDURE:  Procedure(s): CONIZATION CERVIX WITH BIOPSY (N/A)  SURGEON:  Surgeon(s) and Role:    * Tilda BurrowJohn Micharl Helmes V, MD - Primary  PHYSICIAN ASSISTANT:   ASSISTANTS: [place] CST   ANESTHESIA:   local and general  EBL:  Total I/O In: 1300 [I.V.:1300] Out: -   BLOOD ADMINISTERED:none  DRAINS: none   LOCAL MEDICATIONS USED:  MARCAINE    and Amount: 10 ml  SPECIMEN:  Source of Specimen:  cervical cone, tagged at 12 oclock  DISPOSITION OF SPECIMEN:  PATHOLOGY  COUNTS:  YES  TOURNIQUET:  * No tourniquets in log *  DICTATION: .Dragon Dictation  PLAN OF CARE: Discharge to home after PACU  PATIENT DISPOSITION:  PACU - hemodynamically stable.   Delay start of Pharmacological VTE agent (>24hrs) due to surgical blood loss or risk of bleeding: not applicable  Details of procedure: Patient was taken operating room prepped and draped for vaginal procedure, high lithotomy leg support utilized, candycane stirrups, perineum prepped and draped. Timeout was conducted speculum was inserted and the very elongated cervix identified with evidence of prior conization. The endocervical tissues could be visualized beyond the previous surgical incision line. The cervix was infiltrated with Marcaine with epinephrine x10 cc plan stitches placed at 3:00 and 9:00 to try to reduce flow from the cervical branches of the uterine vessels. The conization specimen was then taken beginning at 3:00 performing a circumferential cutting circumscribing the area in question, the transition zone, removing a 1 cm deep by 1.5 cm wide cone specimen removing sufficient tissue and in apparent intact fashion, and tagging the specimen at 12:00. The residual cone bed was addressed by placing Sturmdorf sutures 2 in the posterior cervix and one  anteriorly resulting in continued good hemostasis. Monsel solution was applied to the cervical surfaces, where the cutting and then performed, and excellent hemostasis was documented. Inspection for couple minutes was followed by completion of the procedure, and patient went to recovery in stable condition. She'll be discharged home today.

## 2014-02-17 NOTE — H&P (View-Only) (Signed)
  Preoperative History and Physical  Dorothy Strong is a 28 y.o. 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  Proposed surgery: Conization of cervix  Past Medical History   Diagnosis  Date   .  Condyloma acuminatum    .  Depression    .  Abnormal Pap smear of cervix     Past Surgical History   Procedure  Laterality  Date   .  Tonsillectomy     .  Adenoidectomy     .  Colposcopy      OB History    Grav  Para  Term  Preterm  Abortions  TAB  SAB  Ect  Mult  Living    1  1  1        1      Patient denies any cervical dysplasia or STIs.  (Not in a hospital admission)  No Known Allergies  Social History: reports that she has been smoking Cigarettes. She has a 8 pack-year smoking history. She has never used smokeless tobacco. She reports that she does not drink alcohol or use illicit drugs.  Family History   Problem  Relation  Age of Onset   .  Cancer  Other      prostate   .  Diabetes  Other    .  Arthritis  Other    .  Hypertension  Other    .  Hypertension  Father    Review of Systems: Noncontributory  PHYSICAL EXAM:  Blood pressure 100/60, height 5\' 5"  (1.651 m), weight 126 lb (57.153 kg).  General appearance - alert, well appearing, and in no distress  Chest - clear to auscultation, no wheezes, rales or rhonchi, symmetric air entry  Heent: palate visualized, high palate, intact. Good airway access suspected  Heart - normal rate and regular rhythm  Abdomen - soft, nontender, nondistended, no masses or organomegaly  Pelvic - VULVA: normal appearing vulva with no masses, tenderness or lesions, VAGINA: Moderate vaginal discharge, +whiff test, CERVIX: normal appearing cervix without discharge or lesions, UTERUS: uterus is normal size, shape, consistency and nontender, ADNEXA: normal adnexa in size, nontender and no masses.  Extremities - peripheral pulses normal, no pedal edema, no clubbing or cyanosis  Labs:  No results found  for this or any previous visit (from the past 336 hour(s)).  Imaging Studies:  No results found.  Assessment:  Cervical dysplasia with high grade endocervical involvement.  BV  Patient Active Problem List    Diagnosis  Date Noted   .  Dysplasia of cervix, low grade (CIN 1)  01/16/2014   .  Abnormal uterine bleeding  10/15/2013   .  Bladder infection  10/15/2013   .  S/P LEEP  09/10/2013   .  Condyloma acuminatum  07/21/2013   .  Abnormal finding on Pap smear  07/21/2013   Plan:  1. Patient will undergo surgical management with conization of cervix.  CKC scheduled for 02/17/14  2. Bacterial vaginosis, Rx metronidazole today bid x 7d

## 2014-02-17 NOTE — Anesthesia Procedure Notes (Signed)
Procedure Name: LMA Insertion Date/Time: 02/17/2014 10:03 AM Performed by: Despina HiddenIDACAVAGE, Dorothy Duchesne J Pre-anesthesia Checklist: Patient identified, Emergency Drugs available, Suction available and Patient being monitored Patient Re-evaluated:Patient Re-evaluated prior to inductionOxygen Delivery Method: Circle system utilized Preoxygenation: Pre-oxygenation with 100% oxygen Intubation Type: IV induction Ventilation: Mask ventilation without difficulty LMA: LMA inserted LMA Size: 3.0 Grade View: Grade I Tube type: Oral Number of attempts: 1 Placement Confirmation: positive ETCO2 and breath sounds checked- equal and bilateral Tube secured with: Tape Dental Injury: Teeth and Oropharynx as per pre-operative assessment

## 2014-02-17 NOTE — Transfer of Care (Signed)
Immediate Anesthesia Transfer of Care Note  Patient: Dorothy Strong  Procedure(s) Performed: Procedure(s): CONIZATION CERVIX WITH BIOPSY (N/A)  Patient Location: PACU  Anesthesia Type:General  Level of Consciousness: awake and patient cooperative  Airway & Oxygen Therapy: Patient Spontanous Breathing and Patient connected to face mask oxygen  Post-op Assessment: Report given to PACU RN, Post -op Vital signs reviewed and stable and Patient moving all extremities  Post vital signs: Reviewed and stable  Complications: No apparent anesthesia complications

## 2014-02-17 NOTE — Interval H&P Note (Signed)
History and Physical Interval Note:  02/17/2014 9:14 AM  Dorothy Strong  has presented today for surgery, with the diagnosis of carcinoma in situ of cervix  The various methods of treatment have been discussed with the patient and family. After consideration of risks, benefits and other options for treatment, the patient has consented to  Procedure(s): CONIZATION CERVIX WITH BIOPSY (N/A) as a surgical intervention, for the patient had a prior LEEP in the office showing Cin II-III with endocervical margin involvement. Cold Knife Conization is planned .  The patient's history has been reviewed, patient examined, no change in status, stable for surgery.  I have reviewed the patient's chart and labs.  Questions were answered to the patient's satisfaction.     Tilda BurrowFERGUSON,Dorothy Strong

## 2014-02-17 NOTE — Anesthesia Preprocedure Evaluation (Signed)
Anesthesia Evaluation  Patient identified by MRN, date of birth, ID band Patient awake    Reviewed: Allergy & Precautions, H&P , NPO status , Patient's Chart, lab work & pertinent test results  Airway Mallampati: II TM Distance: <3 FB     Dental  (+) Partial Lower, Teeth Intact   Pulmonary Current Smoker,  breath sounds clear to auscultation        Cardiovascular negative cardio ROS  Rhythm:Regular Rate:Normal     Neuro/Psych PSYCHIATRIC DISORDERS Depression    GI/Hepatic negative GI ROS,   Endo/Other    Renal/GU      Musculoskeletal   Abdominal   Peds  Hematology   Anesthesia Other Findings   Reproductive/Obstetrics                           Anesthesia Physical Anesthesia Plan  ASA: II  Anesthesia Plan: General   Post-op Pain Management:    Induction: Intravenous  Airway Management Planned: LMA  Additional Equipment:   Intra-op Plan:   Post-operative Plan: Extubation in OR  Informed Consent: I have reviewed the patients History and Physical, chart, labs and discussed the procedure including the risks, benefits and alternatives for the proposed anesthesia with the patient or authorized representative who has indicated his/her understanding and acceptance.     Plan Discussed with:   Anesthesia Plan Comments:         Anesthesia Quick Evaluation

## 2014-02-18 ENCOUNTER — Encounter (HOSPITAL_COMMUNITY): Payer: Self-pay | Admitting: Obstetrics and Gynecology

## 2014-02-20 ENCOUNTER — Telehealth: Payer: Self-pay | Admitting: Obstetrics and Gynecology

## 2014-02-20 NOTE — Telephone Encounter (Signed)
Pt states had a procedure (Conization Cervix with Biopsy) on 02/17/2014. Pt requesting to go swimming in pool, per Dr. Emelda FearFerguson will be ok. Pt verbalized understanding.

## 2014-02-24 ENCOUNTER — Encounter: Payer: Self-pay | Admitting: Obstetrics and Gynecology

## 2014-02-25 ENCOUNTER — Encounter: Payer: Self-pay | Admitting: Obstetrics and Gynecology

## 2014-02-25 ENCOUNTER — Ambulatory Visit (INDEPENDENT_AMBULATORY_CARE_PROVIDER_SITE_OTHER): Payer: Medicaid Other | Admitting: Obstetrics and Gynecology

## 2014-02-25 VITALS — BP 120/76 | Ht 65.0 in | Wt 134.0 lb

## 2014-02-25 DIAGNOSIS — Z9889 Other specified postprocedural states: Secondary | ICD-10-CM | POA: Insufficient documentation

## 2014-02-25 MED ORDER — HYDROCODONE-ACETAMINOPHEN 5-325 MG PO TABS: 1.0000 | ORAL_TABLET | Freq: Four times a day (QID) | ORAL | Status: DC | PRN

## 2014-02-25 MED ORDER — METRONIDAZOLE 0.75 % VA GEL: 1.0000 | Freq: Every day | VAGINAL | Status: DC

## 2014-02-25 NOTE — Addendum Note (Signed)
Addended by: Tilda BurrowFERGUSON, Griffith Santilli V on: 02/25/2014 11:43 AM   Modules accepted: Orders

## 2014-02-25 NOTE — Progress Notes (Signed)
This chart was scribed by Leone PayorSonum Patel, Medical Scribe, for Dr. Christin BachJohn Elzora Cullins on 02/25/14 at 11:15 AM. This chart was reviewed by Dr. Christin BachJohn Dynver Clemson for accuracy.   Subjective:  Dorothy Strong is a 28 y.o. female who presents to the clinic 8 days status post cervical conization   Patient reports abdominal cramping. Pt states that her legs and feet have been swelling for the past few days. She rates her pain an 8/10 currently. Pt states that she is having some vaginal discharge. Pt with increased discomfort compared to prior tx's   Review of Systems Negative except leg and feet swelling, vaginal discharge, abdominal cramping  She has been eating a regular diet without difficulty.   Bowel movements are normal. Pain is not well controlled.  Medications being used: prescription NSAID's including vicodin and .Marland Kitchen.  Objective:  BP 120/76  Ht 5\' 5"  (1.651 m)  Wt 134 lb (60.782 kg)  BMI 22.30 kg/m2 General:Well developed, well nourished.  No acute distress. Abdomen: Bowel sounds normal, soft, non-tender. Pelvic Exam:    External Genitalia:  Normal.    Vagina: Normal    Bimanual: Normal    Cervix: large area healing slowly, will benefit from Vaginal antibiotic    Uterus: Normal    Adnexa: not checked  Incision(s):   Healing well, no drainage, no erythema, no hernia, no swelling, no dehiscence, incision well approximated.  Surgical pathology results from procedure show the following:  Cervix, LEEP, at 12 o'clock - HIGH GRADE SQUAMOUS INTRAEPITHELIAL LESION, CIN-III (SEVERE DYSPLASIA/CIS) WITH ENDOCERVICAL INVOLVEMENT. - RESECTION MARGINS ARE NEGATIVE FOR DYSPLASIA.   Assessment:  Post-Op 8 days s/p cervical conization with biopsy   Doing well postoperatively.   Plan:  1.Wound care discussed, will prescribe Cleocin vaginal cream 2. .Continue any current medications. 3. Activity restrictions: no lifting more than no sex or tampons pounds, no sports and douching 4. return to work: 2 wk 5.  Follow up in  4 wk.

## 2014-02-25 NOTE — Patient Instructions (Signed)
Cervicitis °Cervicitis is a soreness and puffiness (inflammation) of the cervix.  °HOME CARE °· Do not have sex (intercourse) until your doctor says it is okay. °· Do not have sex until your partner is treated or as told by your doctor. °· Take your antibiotic medicine as told. Finish it even if you start to feel better. °GET HELP IF:  °· Your symptoms that brought you to the doctor come back. °· You have a fever. °MAKE SURE YOU:  °· Understand these instructions. °· Will watch your condition. °· Will get help right away if you are not doing well or get worse. °Document Released: 04/04/2008 Document Revised: 07/01/2013 Document Reviewed: 12/18/2012 °ExitCare® Patient Information ©2015 [employer]. This information is not intended to replace advice given to you by your health care provider. Make sure you discuss any questions you have with your health care provider. ° °

## 2014-03-25 ENCOUNTER — Ambulatory Visit: Payer: Self-pay | Admitting: Obstetrics and Gynecology

## 2014-03-30 ENCOUNTER — Ambulatory Visit: Payer: Self-pay | Admitting: Obstetrics and Gynecology

## 2014-04-01 ENCOUNTER — Encounter: Payer: Self-pay | Admitting: Obstetrics and Gynecology

## 2014-04-01 ENCOUNTER — Ambulatory Visit (INDEPENDENT_AMBULATORY_CARE_PROVIDER_SITE_OTHER): Payer: Medicaid Other | Admitting: Obstetrics and Gynecology

## 2014-04-01 VITALS — BP 112/68 | Ht 65.0 in | Wt 127.0 lb

## 2014-04-01 DIAGNOSIS — Z9889 Other specified postprocedural states: Secondary | ICD-10-CM

## 2014-04-01 DIAGNOSIS — N871 Moderate cervical dysplasia: Secondary | ICD-10-CM

## 2014-04-01 NOTE — Progress Notes (Signed)
Patient ID: Dorothy Strong, female   DOB: Jul 03, 1986, 28 y.o.   MRN: [id number] Pt here today for recheck from surgery. Pt states that she has just stopped bleeding about a week ago. Pt denies any pain or discomfort at this time.     Subjective:  Dorothy Strong is a 28 y.o. female who presents to the clinic 4 weeks status post CKC.    Review of Systems Negative  She has been eating a regular diet without difficulty.   Bowel movements are normal. The patient is not having any pain.  Objective:  BP 112/68  Ht  (1.651 m)  Wt 127 lb (57.607 kg)  BMI 21.13 kg/m2  LMP 03/25/2014 General:Well developed, well nourished.  No acute distress. Abdomen: Bowel sounds normal, soft, non-tender. Pelvic Exam:    External Genitalia:  Normal.    Vagina: Normal    BimanDiagnosis Cervix, LEEP, at 12 o'clock - HIGH GRADE SQUAMOUS INTRAEPITHELIAL LESION, CIN-III (SEVERE DYSPLASIA/CIS) WITH ENDOCERVICAL INVOLVEMENT. - RESECTION MARGINS ARE NEGATIVE FOR DYSPLASIA. Valinda Hoar MD Pathologist, Electronic Signature (Case signed 02/18/2014)  Specimen Clinicalual: Normal    Cervix: Normal    Uterus: Normal    Adnexa: Normal  Incision(s):   Healing well, no drainage, no erythema, no hernia, no swelling, no dehiscence, incision well approximated.   Assessment:  Post-Op 4 weeks s/p CKC    Doing well postoperatively.   Plan:  1.Wound care discussed  2. .Continue any current medications. 3. Activity restrictions: none 4. return to work: now. 5. Follow up in 1 year for a pap smear

## 2014-04-16 ENCOUNTER — Telehealth: Payer: Self-pay | Admitting: Obstetrics and Gynecology

## 2014-04-17 ENCOUNTER — Emergency Department (HOSPITAL_COMMUNITY)
Admission: EM | Admit: 2014-04-17 | Discharge: 2014-04-17 | Disposition: A | Discharge status: Home / Self Care | Payer: Medicaid Other | Attending: Emergency Medicine | Admitting: Emergency Medicine

## 2014-04-17 ENCOUNTER — Emergency Department (HOSPITAL_COMMUNITY): Payer: Medicaid Other

## 2014-04-17 ENCOUNTER — Encounter (HOSPITAL_COMMUNITY): Payer: Self-pay | Admitting: Emergency Medicine

## 2014-04-17 DIAGNOSIS — R300 Dysuria: Secondary | ICD-10-CM

## 2014-04-17 DIAGNOSIS — Z8619 Personal history of other infectious and parasitic diseases: Secondary | ICD-10-CM | POA: Insufficient documentation

## 2014-04-17 DIAGNOSIS — Z3202 Encounter for pregnancy test, result negative: Secondary | ICD-10-CM | POA: Insufficient documentation

## 2014-04-17 DIAGNOSIS — N898 Other specified noninflammatory disorders of vagina: Secondary | ICD-10-CM

## 2014-04-17 DIAGNOSIS — Z720 Tobacco use: Secondary | ICD-10-CM | POA: Insufficient documentation

## 2014-04-17 DIAGNOSIS — R109 Unspecified abdominal pain: Secondary | ICD-10-CM | POA: Diagnosis not present

## 2014-04-17 DIAGNOSIS — Z792 Long term (current) use of antibiotics: Secondary | ICD-10-CM | POA: Diagnosis not present

## 2014-04-17 DIAGNOSIS — Z79899 Other long term (current) drug therapy: Secondary | ICD-10-CM | POA: Diagnosis not present

## 2014-04-17 DIAGNOSIS — F329 Major depressive disorder, single episode, unspecified: Secondary | ICD-10-CM | POA: Insufficient documentation

## 2014-04-17 LAB — URINALYSIS, ROUTINE W REFLEX MICROSCOPIC
BILIRUBIN URINE: NEGATIVE
GLUCOSE, UA: NEGATIVE mg/dL
LEUKOCYTES UA: NEGATIVE
NITRITE: NEGATIVE
Protein, ur: NEGATIVE mg/dL
SPECIFIC GRAVITY, URINE: 1.015 (ref 1.005–1.030)
Urobilinogen, UA: 0.2 mg/dL (ref 0.0–1.0)
pH: 6 (ref 5.0–8.0)

## 2014-04-17 LAB — URINE MICROSCOPIC-ADD ON

## 2014-04-17 LAB — WET PREP, GENITAL
CLUE CELLS WET PREP: NONE SEEN
Trich, Wet Prep: NONE SEEN
WBC WET PREP: NONE SEEN
YEAST WET PREP: NONE SEEN

## 2014-04-17 LAB — PREGNANCY, URINE: Preg Test, Ur: NEGATIVE

## 2014-04-17 MED ORDER — LIDOCAINE HCL (PF) 1 % IJ SOLN
INTRAMUSCULAR | Status: AC
  Administered 2014-04-17: 5 mL
  Filled 2014-04-17: qty 5

## 2014-04-17 MED ORDER — FLUCONAZOLE 150 MG PO TABS: 150.0000 mg | ORAL_TABLET | Freq: Once | ORAL | Status: DC

## 2014-04-17 MED ORDER — ONDANSETRON HCL 4 MG/2ML IJ SOLN
4.0000 mg | Freq: Once | INTRAMUSCULAR | Status: AC
  Administered 2014-04-17: 4 mg via INTRAVENOUS
  Filled 2014-04-17: qty 2

## 2014-04-17 MED ORDER — AZITHROMYCIN 250 MG PO TABS
1000.0000 mg | ORAL_TABLET | Freq: Once | ORAL | Status: AC
  Administered 2014-04-17: 1000 mg via ORAL
  Filled 2014-04-17: qty 4

## 2014-04-17 MED ORDER — KETOROLAC TROMETHAMINE 30 MG/ML IJ SOLN
30.0000 mg | Freq: Once | INTRAMUSCULAR | Status: AC
  Administered 2014-04-17: 30 mg via INTRAVENOUS
  Filled 2014-04-17: qty 1

## 2014-04-17 MED ORDER — CEFTRIAXONE SODIUM 250 MG IJ SOLR
250.0000 mg | Freq: Once | INTRAMUSCULAR | Status: AC
  Administered 2014-04-17: 250 mg via INTRAMUSCULAR
  Filled 2014-04-17: qty 250

## 2014-04-17 MED ORDER — DOXYCYCLINE HYCLATE 100 MG PO CAPS: 100.0000 mg | ORAL_CAPSULE | Freq: Two times a day (BID) | ORAL | Status: DC

## 2014-04-17 MED ORDER — HYDROCODONE-ACETAMINOPHEN 5-325 MG PO TABS: 2.0000 | ORAL_TABLET | ORAL | Status: DC | PRN

## 2014-04-17 MED ORDER — NAPROXEN 500 MG PO TABS
500.0000 mg | ORAL_TABLET | Freq: Two times a day (BID) | ORAL | Status: DC
Start: 2014-04-17 — End: 2014-06-25

## 2014-04-17 MED ORDER — METRONIDAZOLE 500 MG PO TABS: 500.0000 mg | ORAL_TABLET | Freq: Two times a day (BID) | ORAL | Status: DC

## 2014-04-17 NOTE — Discharge Instructions (Signed)
Pelvic Infection ° °If you have been diagnosed with a pelvic infection such as a sexually transmitted disease, you will need to be treated with antibiotics. Please take the medicines as prescribed. Some of these tests do not come back for 1-2 days in which case if they turn positive you will receive a phone call to let you know. If you are contacted and do have an infection consistent with a sexually transmitted disease, then you will need to tell any and all sexual partners that you have had in the last 6 months no so that they can be tested and treated as well. If you should develop severe or worsening pain in your abdomen or the pelvis or develop severe fevers,nausea or vomiting that prevent you from taking your medications, return to the emergency department immediately. Otherwise contact your local physician or county health department for a follow up appointment to complete STD testing including HIV and syphilis.  See the list of phone numbers below. ° °RESOURCE GUIDE ° °Dental Problems ° °Patients with Medicaid: °[facility]                     [facility] °[address]                                           [address] °Phone:  [phone]                                                  Phone:  [phone] ° °If unable to pay or uninsured, contact:  [facility] or [facility] to become qualified for the adult dental clinic. ° °Chronic Pain Problems °Contact [facility]  [phone] °Patients need to be referred by their primary care doctor. ° °Insufficient Money for Medicine °Contact [employer]:  call "211" or [facility] 271-5999. ° °No Primary Care Doctor °Call [facility]  832-8000 °Other agencies that provide inexpensive medical care °   [facility]  [phone] °   [facility]  832-7272 °   [facility]  [phone] °   [facility]  832-4777 °   [facility]  [phone] ° [facility]  [phone] ° °Psychological Services °[facility]  [phone] °[facility]  [phone] °[facility]   [phone] (emergency services [phone]) ° °Substance Abuse Resources °[facility]  336-882-2125 °[facility] 336-784-9470 °[facility] 336-285-9073 °[facility] 336-845-3988 °Residential & Outpatient Substance Abuse Program  800-659-3381 ° °Abuse/Neglect °[place] [facility] (336) 641-3795 °[place] [facility] Hotline 800-378-5315 (After Hours) ° °Emergency Shelter °[facility] (336) 271-5985 ° °Maternity Homes °[facility] (336) 275-9566 °[facility] (704) 372-4663 ° °MRSA Hotline #:   832-7006 ° ° ° °[place] [facility] ° °[facility] [facility]     [employer]                          [facility] °[address] [place]                         [address]      [address]  °[place]                                                [place]                            [place] °Phone:  349-3220                                   Phone:  [phone]                 Phone:  342-8140 ° °[facility] °Phone:  342-8316 ° °[place] [facility] Hotline °(336) 342-1394 °(336) 342-3537 (After Hours) ° ° ° °

## 2014-04-17 NOTE — Telephone Encounter (Signed)
Pt rx for Diflucan escribed

## 2014-04-17 NOTE — ED Provider Notes (Signed)
CSN: [id number]     Arrival date & time 04/17/14  1644 History   First MD Initiated Contact with Patient 04/17/14 1814     Chief Complaint  Patient presents with  . Dysuria     (Consider location/radiation/quality/duration/timing/severity/associated sxs/prior Treatment) HPI Comments: Pt is a 28 -year-old female with a history of left flank pain, sharp and stabbing, radiating to the left lower quadrant and suprapubic area associated with a dysuria that she describes as urinating sand. She denies any fevers or chills but has had some nausea overnight. Nothing makes this better or worse, is not positional, it is not associated with hematuria, diarrhea, constipation or blood in the stools. Her symptoms are currently moderate, she rates an 8/10, she has had no medication prior to arrival. She does have a history of urinary infections, recent colposcopy and conization of the cervix.  Patient is a 28 y.o. female presenting with dysuria. The history is provided by the patient.  Dysuria   Past Medical History  Diagnosis Date  . Condyloma acuminatum   . Depression   . Abnormal Pap smear of cervix    Past Surgical History  Procedure Laterality Date  . Tonsillectomy    . Adenoidectomy    . Colposcopy    . Cervical conization w/bx N/A 02/17/2014    Procedure: CONIZATION CERVIX WITH BIOPSY;  Surgeon: Tilda Burrow, MD;  Location: [facility];  Service: Gynecology;  Laterality: N/A;   Family History  Problem Relation Age of Onset  . Cancer Other     prostate   . Diabetes Other   . Arthritis Other   . Hypertension Other   . Hypertension Father    History  Substance Use Topics  . Smoking status: Current Every Day Smoker -- 1.00 packs/day for 8 years    Types: Cigarettes  . Smokeless tobacco: Never Used  . Alcohol Use: No   OB History   Grav Para Term Preterm Abortions TAB SAB Ect Mult Living   1 1 1       1      Review of Systems  Genitourinary: Positive for dysuria.  All other systems  reviewed and are negative.     Allergies  Review of patient's allergies indicates no known allergies.  Home Medications   Prior to Admission medications   Medication Sig Start Date End Date Taking? Authorizing Provider  ibuprofen (ADVIL,MOTRIN) 200 MG tablet Take 200 mg by mouth every 6 (six) hours as needed (pain).    Yes Historical Provider, MD  megestrol (MEGACE) 40 MG tablet Take 40 mg by mouth daily.   Yes Historical Provider, MD  doxycycline (VIBRAMYCIN) 100 MG capsule Take 1 capsule (100 mg total) by mouth 2 (two) times daily. 04/17/14   Vida Roller, MD  Etonogestrel [facility]) Inject into the skin.      Historical Provider, MD  fluconazole (DIFLUCAN) 150 MG tablet Take 1 tablet (150 mg total) by mouth once. 04/17/14   Tilda Burrow, MD  fluconazole (DIFLUCAN) 150 MG tablet Take 1 tablet (150 mg total) by mouth once. 04/17/14   Vida Roller, MD  HYDROcodone-acetaminophen (NORCO/VICODIN) 5-325 MG per tablet Take 2 tablets by mouth every 4 (four) hours as needed. 04/17/14   Vida Roller, MD  metroNIDAZOLE (FLAGYL) 500 MG tablet Take 1 tablet (500 mg total) by mouth 2 (two) times daily. 04/17/14   Vida Roller, MD  naproxen (NAPROSYN) 500 MG tablet Take 1 tablet (500 mg total) by mouth 2 (  two) times daily with a meal. 04/17/14   Vida RollerBrian D Ceili Boshers, MD   BP 124/81  Pulse 93  Temp(Src) 97.8 F (36.6 C) (Oral)  Resp 18  Ht 5\' 5"  (1.651 m)  Wt 130 lb (58.968 kg)  BMI 21.63 kg/m2  SpO2 100%  LMP 03/25/2014 Physical Exam  Nursing note and vitals reviewed. Constitutional: She appears well-developed and well-nourished. No distress.  HENT:  Head: Normocephalic and atraumatic.  Mouth/Throat: Oropharynx is clear and moist. No oropharyngeal exudate.  Eyes: Conjunctivae and EOM are normal. Pupils are equal, round, and reactive to light. Right eye exhibits no discharge. Left eye exhibits no discharge. No scleral icterus.  Neck: Normal range of motion. Neck supple. No JVD present. No  thyromegaly present.  Cardiovascular: Normal rate, regular rhythm, normal heart sounds and intact distal pulses.  Exam reveals no gallop and no friction rub.   No murmur heard. Pulmonary/Chest: Effort normal and breath sounds normal. No respiratory distress. She has no wheezes. She has no rales.  Abdominal: Soft. Bowel sounds are normal. She exhibits no distension and no mass. There is no tenderness.  Left CVA tenderness mild but present  Genitourinary:  Chaperone present for pelvic exam: Mild cervical motion tenderness, white thick discharge in the vaginal vault, no bleeding, no adnexal tenderness or masses  Musculoskeletal: Normal range of motion. She exhibits no edema and no tenderness.  Lymphadenopathy:    She has no cervical adenopathy.  Neurological: She is alert. Coordination normal.  Skin: Skin is warm and dry. No rash noted. No erythema.  Psychiatric: She has a normal mood and affect. Her behavior is normal.    ED Course  Procedures (including critical care time) Labs Review Labs Reviewed  URINALYSIS, ROUTINE W REFLEX MICROSCOPIC - Abnormal; Notable for the following:    Hgb urine dipstick LARGE (*)    Ketones, ur TRACE (*)    All other components within normal limits  URINE MICROSCOPIC-ADD ON - Abnormal; Notable for the following:    Squamous Epithelial / LPF FEW (*)    All other components within normal limits  GC/CHLAMYDIA PROBE AMP  WET PREP, GENITAL  URINE CULTURE  PREGNANCY, URINE    Imaging Review Ct Renal Stone Study  04/17/2014   CLINICAL DATA:  Left flank pain x2 days.  EXAM: CT ABDOMEN AND PELVIS WITHOUT CONTRAST  TECHNIQUE: Multidetector CT imaging of the abdomen and pelvis was performed following the standard protocol without IV contrast.  COMPARISON:  Abdomen 08/28/ 2007.  FINDINGS: Liver normal. Spleen normal. Pancreas normal. No biliary distention. Gallbladder nondistended.  Adrenals normal. Kidneys normal. No hydronephrosis. No evidence of obstructing  ureteral stone. Bladder is nondistended. Uterus and adnexa unremarkable. No free pelvic fluid.  No significant adenopathy.  Abdominal aorta normal in caliber.  What appears to be the appendix is normal. No bowel distention. No free air. No mesenteric mass.  Lung bases are clear.  Heart size normal.  No acute bony abnormality  IMPRESSION: No acute or focal abnormality.   Electronically Signed   By: Maisie Fushomas  Register   On: 04/17/2014 19:58      MDM   Final diagnoses:  Vaginal discharge  Flank pain    The patient has normal vital signs, her bedside ultrasound suggests mild hydronephrosis, CT scan ordered to further rule out larger kidney stone as the patient is having ongoing symptoms. Urinalysis reveals lots of red blood cells without any white blood cells or bacteria, ketones trace. Pain medication ordered, CT scan without contrast.  Emergency Focused Ultrasound Exam Limited retroperitoneal ultrasound of kidneys  Performed and interpreted by Dr. Hyacinth MeekerMiller Indication: flank pain Focused abdominal ultrasound with both kidneys imaged in transverse and longitudinal planes in real-time. Interpretation: Mild L hydronephrosis visualized.   Images archived electronically  CT scan imaging shows no significant findings, pelvic exam suggests pelvic infection, she will be treated with medications as below, vital signs remain normal, pain medications given as below and prescribed for home.  Meds given in ED:  Medications  cefTRIAXone (ROCEPHIN) injection 250 mg (not administered)  azithromycin (ZITHROMAX) tablet 1,000 mg (not administered)  ketorolac (TORADOL) 30 MG/ML injection 30 mg (30 mg Intravenous Given 04/17/14 1922)  ondansetron (ZOFRAN) injection 4 mg (4 mg Intravenous Given 04/17/14 1923)    New Prescriptions   DOXYCYCLINE (VIBRAMYCIN) 100 MG CAPSULE    Take 1 capsule (100 mg total) by mouth 2 (two) times daily.   FLUCONAZOLE (DIFLUCAN) 150 MG TABLET    Take 1 tablet (150 mg total) by mouth  once.   HYDROCODONE-ACETAMINOPHEN (NORCO/VICODIN) 5-325 MG PER TABLET    Take 2 tablets by mouth every 4 (four) hours as needed.   METRONIDAZOLE (FLAGYL) 500 MG TABLET    Take 1 tablet (500 mg total) by mouth 2 (two) times daily.   NAPROXEN (NAPROSYN) 500 MG TABLET    Take 1 tablet (500 mg total) by mouth 2 (two) times daily with a meal.      Vida RollerBrian D Reannon Candella, MD 04/17/14 2144

## 2014-04-17 NOTE — ED Notes (Signed)
edp aware that pt is requesting pain meds. No orders received

## 2014-04-17 NOTE — Telephone Encounter (Signed)
Spoke with pt. Pt thinks she has a yeast infection. Has been using Monistat, but feels like she needs something more. Pt can't come into office. Can you call in med for yeast? Thanks!!! JSY      Pt's call back # is [phone].

## 2014-04-17 NOTE — ED Notes (Signed)
Low back pain with dysuria

## 2014-05-11 ENCOUNTER — Encounter (HOSPITAL_COMMUNITY): Payer: Self-pay | Admitting: Emergency Medicine

## 2014-05-22 ENCOUNTER — Ambulatory Visit (INDEPENDENT_AMBULATORY_CARE_PROVIDER_SITE_OTHER): Payer: Medicaid Other | Admitting: Obstetrics and Gynecology

## 2014-05-22 ENCOUNTER — Encounter: Payer: Self-pay | Admitting: Obstetrics and Gynecology

## 2014-05-22 VITALS — BP 100/60 | Ht 65.0 in | Wt 117.0 lb

## 2014-05-22 DIAGNOSIS — Z3202 Encounter for pregnancy test, result negative: Secondary | ICD-10-CM

## 2014-05-22 DIAGNOSIS — Z3200 Encounter for pregnancy test, result unknown: Secondary | ICD-10-CM

## 2014-05-22 DIAGNOSIS — Z3049 Encounter for surveillance of other contraceptives: Secondary | ICD-10-CM

## 2014-05-22 LAB — POCT URINE PREGNANCY: Preg Test, Ur: NEGATIVE

## 2014-05-22 MED ORDER — NORGESTIMATE-ETH ESTRADIOL 0.25-35 MG-MCG PO TABS: 1.0000 | ORAL_TABLET | Freq: Every day | ORAL | Status: DC

## 2014-05-22 NOTE — Progress Notes (Signed)
  GYNECOLOGY CLINIC PROCEDURE NOTE  Dorothy Strong is a 28 y.o. G1P1001 here for Nexplanon removal.  Last pap smear was on 12/14 and was abnorma, had CKC in August, will have first pap in dec 15.  No other gynecologic concerns. She states that she wants to change her birth control methods because of the estrogen that is them and that she has a hx of cervical CA. She reports that she has had LEEP, coloposcopy, and Conization procedures completed. She states that she smokes.    Nexplanon Removal Patient was given informed consent for removal of her Nexplanon.  Appropriate time out taken. Nexplanon site identified.  Area prepped in usual sterile fashon. One ml of 1% lidocaine was used to anesthetize the area at the distal end of the implant. A small stab incision was made right beside the implant on the distal portion.  The Nexplanon rod was grasped using hemostats and removed without difficulty.  There was minimal blood loss. There were no complications.  3 ml of 1% lidocaine was injected around the incision for post-procedure analgesia.  Steri-strips were applied over the small incision.  A pressure bandage was applied to reduce any bruising.  The patient tolerated the procedure well and was given post procedure instructions.  Patient is planning to use ocp for contraception.  A:  1. contr management, switch from nexplanon to ocp  P: 1. Lengthy discussion about birth control methods with pt.   This chart was scribed for Tilda BurrowJohn Carver Murakami V, MD by Chestine SporeSoijett Blue, ED Scribe. The patient was seen in room 2 at 12:28 PM.

## 2014-05-22 NOTE — Progress Notes (Signed)
Patient ID: Dorothy Strong, female   DOB: 19-Jul-1985, 28 y.o.   MRN: [id number] Pt here today for nexplanon removal. Pt wants to discuss another form of BC. Pt is unsure of what she should take due to her cervical cancer issues. Pt states that she is bleeding all the time.

## 2014-06-16 ENCOUNTER — Telehealth: Payer: Self-pay | Admitting: Obstetrics and Gynecology

## 2014-06-16 NOTE — Telephone Encounter (Signed)
If bleeding more than a week stop medication for 5 days and start a new pack.

## 2014-06-17 NOTE — Telephone Encounter (Signed)
I have tried both numbers we have in the system multiple times and can not reach the pt. Both numbers appear to be out of service.

## 2014-06-25 ENCOUNTER — Ambulatory Visit (INDEPENDENT_AMBULATORY_CARE_PROVIDER_SITE_OTHER): Payer: Medicaid Other | Admitting: Obstetrics and Gynecology

## 2014-06-25 ENCOUNTER — Other Ambulatory Visit (HOSPITAL_COMMUNITY)
Admission: RE | Admit: 2014-06-25 | Discharge: 2014-06-25 | Disposition: A | Discharge status: Home / Self Care | Payer: Medicaid Other | Source: Ambulatory Visit | Attending: Obstetrics and Gynecology | Admitting: Obstetrics and Gynecology

## 2014-06-25 ENCOUNTER — Encounter: Payer: Self-pay | Admitting: Obstetrics and Gynecology

## 2014-06-25 VITALS — BP 110/60 | Ht 65.0 in | Wt 116.0 lb

## 2014-06-25 DIAGNOSIS — Z01419 Encounter for gynecological examination (general) (routine) without abnormal findings: Secondary | ICD-10-CM | POA: Diagnosis present

## 2014-06-25 DIAGNOSIS — D069 Carcinoma in situ of cervix, unspecified: Secondary | ICD-10-CM

## 2014-06-25 DIAGNOSIS — Z0000 Encounter for general adult medical examination without abnormal findings: Secondary | ICD-10-CM

## 2014-06-25 DIAGNOSIS — Z1151 Encounter for screening for human papillomavirus (HPV): Secondary | ICD-10-CM | POA: Insufficient documentation

## 2014-06-25 NOTE — Progress Notes (Signed)
Patient ID: Dorothy SaxEmily A Gehling, female   DOB: 10-Dec-1985, 28 y.o.   MRN: [id number] Pt here today for annual exam. Pt denies any problems or concerns at this time.   Assessment:  Annual Gyn Exam S/p ckc for abnormal pap. 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 pap smear done, with hpv testing, next pap due annually. 2. return annually or prn 3    Annual mammogram advised Subjective:  Dorothy Strong is a 28 y.o. female G1P1001 who presents for annual exam. Patient's last menstrual period was 06/11/2014. The patient has complaints today of none  The following portions of the patient's history were reviewed and updated as appropriate: allergies, current medications, past family history, past medical history, past social history, past surgical history and problem list. Past Medical History  Diagnosis Date  . Condyloma acuminatum   . Depression   . Abnormal Pap smear of cervix     Past Surgical History  Procedure Laterality Date  . Tonsillectomy    . Adenoidectomy    . Colposcopy    . Cervical conization w/bx N/A 02/17/2014    Procedure: CONIZATION CERVIX WITH BIOPSY;  Surgeon: Tilda BurrowJohn Mandy Peeks V, MD;  Location: [facility];  Service: Gynecology;  Laterality: N/A;    Current outpatient prescriptions: norgestimate-ethinyl estradiol (ORTHO-CYCLEN,SPRINTEC,PREVIFEM) 0.25-35 MG-MCG tablet, Take 1 tablet by mouth daily., Disp: 1 Package, Rfl: 11  Review of Systems Constitutional: negative Gastrointestinal: negative Genitourinary: menses "alright"  Objective:  BP 110/60 mmHg  Ht 5\' 5"  (1.651 m)  Wt 116 lb (52.617 kg)  BMI 19.30 kg/m2  LMP 06/11/2014   BMI: Body mass index is 19.3 kg/(m^2).  General Appearance: Alert, appropriate appearance for age. No acute distress HEENT: Grossly normal Neck / Thyroid:  Cardiovascular: RRR; normal S1, S2, no murmur Lungs: CTA bilaterally Back: No CVAT Breast Exam: No  dimpling, nipple retraction or discharge. No masses or nodes., Normal to inspection, Normal breast tissue bilaterally and No masses or nodes.No dimpling, nipple retraction or discharge. Gastrointestinal: Soft, non-tender, no masses or organomegaly Pelvic Exam: Vulva and vagina appear normal. Bimanual exam reveals normal uterus and adnexa. Cervix: well healed s/.p ckc Rectovaginal: not indicated Lymphatic Exam: Non-palpable nodes in neck, clavicular, axillary, or inguinal regions Skin: no rash or abnormalities Neurologic: Normal gait and speech, no tremor  Psychiatric: Alert and oriented, appropriate affect.  Urinalysis:Not done  Christin BachJohn Iyan Flett. MD Pgr [phone] 2:52 PM

## 2014-06-25 NOTE — Patient Instructions (Signed)
Annual visits with pap every year x 20 years of yearly visits.

## 2014-06-30 LAB — CYTOLOGY - PAP

## 2014-07-28 ENCOUNTER — Encounter (HOSPITAL_COMMUNITY): Payer: Self-pay | Admitting: Emergency Medicine

## 2014-07-28 ENCOUNTER — Emergency Department (HOSPITAL_COMMUNITY)
Admission: EM | Admit: 2014-07-28 | Discharge: 2014-07-28 | Disposition: A | Discharge status: Home / Self Care | Payer: Medicaid Other | Attending: Emergency Medicine | Admitting: Emergency Medicine

## 2014-07-28 DIAGNOSIS — Z79899 Other long term (current) drug therapy: Secondary | ICD-10-CM | POA: Insufficient documentation

## 2014-07-28 DIAGNOSIS — Z8619 Personal history of other infectious and parasitic diseases: Secondary | ICD-10-CM | POA: Insufficient documentation

## 2014-07-28 DIAGNOSIS — L739 Follicular disorder, unspecified: Secondary | ICD-10-CM | POA: Diagnosis not present

## 2014-07-28 DIAGNOSIS — Z8659 Personal history of other mental and behavioral disorders: Secondary | ICD-10-CM | POA: Diagnosis not present

## 2014-07-28 DIAGNOSIS — Z720 Tobacco use: Secondary | ICD-10-CM | POA: Diagnosis not present

## 2014-07-28 DIAGNOSIS — L02214 Cutaneous abscess of groin: Secondary | ICD-10-CM | POA: Diagnosis present

## 2014-07-28 MED ORDER — HYDROCODONE-ACETAMINOPHEN 5-325 MG PO TABS
1.0000 | ORAL_TABLET | Freq: Once | ORAL | Status: AC
  Administered 2014-07-28: 1 via ORAL
  Filled 2014-07-28: qty 1

## 2014-07-28 MED ORDER — SULFAMETHOXAZOLE-TRIMETHOPRIM 800-160 MG PO TABS
1.0000 | ORAL_TABLET | Freq: Once | ORAL | Status: AC
  Administered 2014-07-28: 1 via ORAL
  Filled 2014-07-28: qty 1

## 2014-07-28 MED ORDER — HYDROCODONE-ACETAMINOPHEN 5-325 MG PO TABS: ORAL_TABLET | ORAL | Status: DC

## 2014-07-28 MED ORDER — SULFAMETHOXAZOLE-TRIMETHOPRIM 800-160 MG PO TABS: 1.0000 | ORAL_TABLET | Freq: Two times a day (BID) | ORAL | Status: DC

## 2014-07-28 NOTE — Discharge Instructions (Signed)

## 2014-07-28 NOTE — ED Notes (Signed)
Patient c/o abscess in right groin. Per patient started 2 days ago and is progressively getting worse. Patient reports small amount of purulent drainage. Denies any fevers.

## 2014-07-28 NOTE — ED Provider Notes (Signed)
CSN: [id number]     Arrival date & time 07/28/14  1736 History   First MD Initiated Contact with Patient 07/28/14 1803     Chief Complaint  Patient presents with  . Abscess     (Consider location/radiation/quality/duration/timing/severity/associated sxs/prior Treatment) HPI  Dorothy Strong is a 29 y.o. female who presents to the Emergency Department complaining of abscess to her right groin for 2 days ago.  She reports noticing a small "bump" that developed after shaving. She states she has tried squeezing the area, but only a small amount of pus was drained. She complains of pain to the touch. She has been soaking in warm water. She denies previous boils, fever, chills, dysuria, vomiting, or history of diabetes or MRSA  Past Medical History  Diagnosis Date  . Condyloma acuminatum   . Depression   . Abnormal Pap smear of cervix    Past Surgical History  Procedure Laterality Date  . Tonsillectomy    . Adenoidectomy    . Colposcopy    . Cervical conization w/bx N/A 02/17/2014    Procedure: CONIZATION CERVIX WITH BIOPSY;  Surgeon: Tilda Burrow, MD;  Location: [facility];  Service: Gynecology;  Laterality: N/A;   Family History  Problem Relation Age of Onset  . Cancer Other     prostate   . Diabetes Other   . Arthritis Other   . Hypertension Other   . Hypertension Father    History  Substance Use Topics  . Smoking status: Current Every Day Smoker -- 1.00 packs/day for 8 years    Types: Cigarettes  . Smokeless tobacco: Never Used  . Alcohol Use: No   OB History    Gravida Para Term Preterm AB TAB SAB Ectopic Multiple Living   Review of Systems  Constitutional: Negative for fever and chills.  Gastrointestinal: Negative for nausea and vomiting.  Musculoskeletal: Negative for joint swelling and arthralgias.  Skin: Negative for color change.       Abscess   Hematological: Negative for adenopathy.  All other systems reviewed and are  negative.     Allergies  Review of patient's allergies indicates no known allergies.  Home Medications   Prior to Admission medications   Medication Sig Start Date End Date Taking? Authorizing Provider  norgestimate-ethinyl estradiol (ORTHO-CYCLEN,SPRINTEC,PREVIFEM) 0.25-35 MG-MCG tablet Take 1 tablet by mouth daily. 05/22/14   Tilda Burrow, MD   BP 138/77 mmHg  Pulse 96  Temp(Src) 98.2 F (36.8 C) (Oral)  Resp 19  Ht  (1.651 m)  Wt 120 lb (54.432 kg)  BMI 19.97 kg/m2  SpO2 99%  LMP 07/07/2014 Physical Exam  Constitutional: She is oriented to person, place, and time. She appears well-developed and well-nourished. No distress.  HENT:  Head: Normocephalic and atraumatic.  Cardiovascular: Normal rate, regular rhythm, normal heart sounds and intact distal pulses.   No murmur heard. Pulmonary/Chest: Effort normal and breath sounds normal. No respiratory distress.  Abdominal: Soft. She exhibits no distension. There is no tenderness. There is no rebound and no guarding.  Musculoskeletal: Normal range of motion.  Neurological: She is alert and oriented to person, place, and time. She exhibits normal muscle tone. Coordination normal.  Skin: Skin is warm and dry.  Dime-sized area of induration to the right vulva. No surrounding erythema or fluctuance no drainage at this time. Patient has shaved the pubic area  Nursing note and vitals reviewed.  ED Course  Procedures (including critical care time) Labs Review Labs Reviewed - No data to display  Imaging Review No results found.   EKG Interpretation None      MDM   Final diagnoses:  Folliculitis    Patient with likely folliculitis versus early developing abscess to the right vulva. No drainage at present, no surrounding erythema mild localized induration. I&D not indicated at this time, Patient agrees to do warm soaks or compresses, antibiotics, and pain medication. I have advised her to return in 2-3 days if the  symptoms are not improving for possible incision and drainage patient verbalizes understanding and agrees to plan.    Ekaterini Capitano L. Trisha Mangleriplett, PA-C 07/31/14 [id number]  Benny LennertJoseph L Zammit, MD 07/31/14 [phone]

## 2014-07-28 NOTE — ED Notes (Signed)
Patient with no complaints at this time. Respirations even and unlabored. Skin warm/dry. Discharge instructions reviewed with patient at this time. Patient given opportunity to voice concerns/ask questions. Patient discharged at this time and left Emergency Department with steady gait.   

## 2014-08-20 ENCOUNTER — Ambulatory Visit: Payer: Medicaid Other | Admitting: Obstetrics and Gynecology

## 2014-10-27 ENCOUNTER — Emergency Department (HOSPITAL_COMMUNITY)
Admission: EM | Admit: 2014-10-27 | Discharge: 2014-10-27 | Disposition: A | Discharge status: Home / Self Care | Payer: Medicaid Other | Attending: Emergency Medicine | Admitting: Emergency Medicine

## 2014-10-27 ENCOUNTER — Encounter (HOSPITAL_COMMUNITY): Payer: Self-pay

## 2014-10-27 DIAGNOSIS — Y9389 Activity, other specified: Secondary | ICD-10-CM | POA: Diagnosis not present

## 2014-10-27 DIAGNOSIS — Z8619 Personal history of other infectious and parasitic diseases: Secondary | ICD-10-CM | POA: Insufficient documentation

## 2014-10-27 DIAGNOSIS — L089 Local infection of the skin and subcutaneous tissue, unspecified: Secondary | ICD-10-CM | POA: Insufficient documentation

## 2014-10-27 DIAGNOSIS — Y9289 Other specified places as the place of occurrence of the external cause: Secondary | ICD-10-CM | POA: Diagnosis not present

## 2014-10-27 DIAGNOSIS — S80861A Insect bite (nonvenomous), right lower leg, initial encounter: Secondary | ICD-10-CM

## 2014-10-27 DIAGNOSIS — Y998 Other external cause status: Secondary | ICD-10-CM | POA: Insufficient documentation

## 2014-10-27 DIAGNOSIS — R000 Tachycardia, unspecified: Secondary | ICD-10-CM | POA: Insufficient documentation

## 2014-10-27 DIAGNOSIS — R51 Headache: Secondary | ICD-10-CM | POA: Insufficient documentation

## 2014-10-27 DIAGNOSIS — Z23 Encounter for immunization: Secondary | ICD-10-CM | POA: Diagnosis not present

## 2014-10-27 DIAGNOSIS — S70361A Insect bite (nonvenomous), right thigh, initial encounter: Secondary | ICD-10-CM | POA: Insufficient documentation

## 2014-10-27 DIAGNOSIS — Z720 Tobacco use: Secondary | ICD-10-CM | POA: Insufficient documentation

## 2014-10-27 DIAGNOSIS — Z79899 Other long term (current) drug therapy: Secondary | ICD-10-CM | POA: Diagnosis not present

## 2014-10-27 DIAGNOSIS — W57XXXA Bitten or stung by nonvenomous insect and other nonvenomous arthropods, initial encounter: Secondary | ICD-10-CM | POA: Diagnosis not present

## 2014-10-27 DIAGNOSIS — Z8659 Personal history of other mental and behavioral disorders: Secondary | ICD-10-CM | POA: Diagnosis not present

## 2014-10-27 MED ORDER — SULFAMETHOXAZOLE-TRIMETHOPRIM 800-160 MG PO TABS: 1.0000 | ORAL_TABLET | Freq: Two times a day (BID) | ORAL | Status: DC

## 2014-10-27 MED ORDER — LORATADINE 10 MG PO TABS: 10.0000 mg | ORAL_TABLET | Freq: Every day | ORAL | Status: DC

## 2014-10-27 MED ORDER — LORATADINE 10 MG PO TABS
10.0000 mg | ORAL_TABLET | Freq: Once | ORAL | Status: AC
  Administered 2014-10-27: 10 mg via ORAL
  Filled 2014-10-27: qty 1

## 2014-10-27 MED ORDER — HYDROCODONE-ACETAMINOPHEN 5-325 MG PO TABS: 1.0000 | ORAL_TABLET | ORAL | Status: DC | PRN

## 2014-10-27 MED ORDER — FAMOTIDINE 20 MG PO TABS
20.0000 mg | ORAL_TABLET | Freq: Once | ORAL | Status: AC
  Administered 2014-10-27: 20 mg via ORAL
  Filled 2014-10-27: qty 1

## 2014-10-27 MED ORDER — TETANUS-DIPHTH-ACELL PERTUSSIS 5-2.5-18.5 LF-MCG/0.5 IM SUSP
0.5000 mL | Freq: Once | INTRAMUSCULAR | Status: AC
  Administered 2014-10-27: 0.5 mL via INTRAMUSCULAR
  Filled 2014-10-27: qty 0.5

## 2014-10-27 MED ORDER — HYDROCODONE-ACETAMINOPHEN 5-325 MG PO TABS
1.0000 | ORAL_TABLET | Freq: Once | ORAL | Status: AC
  Administered 2014-10-27: 1 via ORAL
  Filled 2014-10-27: qty 1

## 2014-10-27 MED ORDER — PREDNISONE 20 MG PO TABS
40.0000 mg | ORAL_TABLET | Freq: Once | ORAL | Status: AC
  Administered 2014-10-27: 40 mg via ORAL
  Filled 2014-10-27: qty 2

## 2014-10-27 NOTE — ED Notes (Signed)
Pt verbalized understanding of no driving and to use caution within 4 hours of taking pain meds due to meds cause drowsiness 

## 2014-10-27 NOTE — ED Provider Notes (Signed)
CSN: [id number]     Arrival date & time 10/27/14  0919 History   First MD Initiated Contact with Patient 10/27/14 [phone]     Chief Complaint  Patient presents with  . Insect Bite     (Consider location/radiation/quality/duration/timing/severity/associated sxs/prior Treatment) Patient is a 29 y.o. female presenting with rash. The history is provided by the patient.  Rash Location:  Leg Leg rash location:  L leg Quality: blistering, itchiness, painful and redness   Pain details:    Quality:  Throbbing   Severity:  Moderate   Onset quality:  Gradual   Duration:  1 day   Timing:  Constant   Progression:  Worsening Chronicity:  New Context: insect bite/sting   Relieved by:  Antihistamines Associated symptoms: headaches    Dorothy Strong is a 29 y.o. female who presents to the ED with an insect bite to the right upper leg just above the knee. She noted the area yesterday and today is worse. There area burns and hurts and now has a blister area in the center. She used ice and took Benadryl last night without relief.   Past Medical History  Diagnosis Date  . Condyloma acuminatum   . Depression   . Abnormal Pap smear of cervix    Past Surgical History  Procedure Laterality Date  . Tonsillectomy    . Adenoidectomy    . Colposcopy    . Cervical conization w/bx N/A 02/17/2014    Procedure: CONIZATION CERVIX WITH BIOPSY;  Surgeon: Tilda Burrow, MD;  Location: [facility];  Service: Gynecology;  Laterality: N/A;   Family History  Problem Relation Age of Onset  . Cancer Other     prostate   . Diabetes Other   . Arthritis Other   . Hypertension Other   . Hypertension Father    History  Substance Use Topics  . Smoking status: Current Every Day Smoker -- 1.00 packs/day for 8 years    Types: Cigarettes  . Smokeless tobacco: Never Used  . Alcohol Use: No   OB History    Gravida Para Term Preterm AB TAB SAB Ectopic Multiple Living   Review of Systems    Musculoskeletal:       Insect bite to right upper leg.   Skin: Positive for rash.       redness  Neurological: Positive for headaches.  all other systems negative    Allergies  Review of patient's allergies indicates no known allergies.  Home Medications   Prior to Admission medications   Medication Sig Start Date End Date Taking? Authorizing Provider  norgestimate-ethinyl estradiol (ORTHO-CYCLEN,SPRINTEC,PREVIFEM) 0.25-35 MG-MCG tablet Take 1 tablet by mouth daily. 05/22/14  Yes Tilda Burrow, MD  HYDROcodone-acetaminophen (NORCO/VICODIN) 5-325 MG per tablet Take 1 tablet by mouth every 4 (four) hours as needed. 10/27/14   Carita Sollars Orlene Och, NP  loratadine (CLARITIN) 10 MG tablet Take 1 tablet (10 mg total) by mouth daily. 10/27/14   Chrissi Crow Orlene Och, NP  sulfamethoxazole-trimethoprim (BACTRIM DS,SEPTRA DS) 800-160 MG per tablet Take 1 tablet by mouth 2 (two) times daily. 10/27/14 11/03/14  Jamaurion Slemmer Orlene Och, NP   BP 109/78 mmHg  Pulse 76  Temp(Src) 97.8 F (36.6 C) (Oral)  Resp 18  Ht  (1.575 m)  Wt 125 lb (56.7 kg)  BMI 22.86 kg/m2  SpO2 100%  LMP 10/20/2014 Physical Exam  Constitutional: She is oriented to person, place,  and time. She appears well-developed and well-nourished.  HENT:  Head: Normocephalic and atraumatic.  Eyes: EOM are normal.  Neck: Neck supple.  Cardiovascular: Tachycardia present.   Pulmonary/Chest: Effort normal.  Abdominal: Soft. There is no tenderness.  Musculoskeletal: Normal range of motion.       Legs: There is an area of redness to the right upper leg and in the center of the red circle there is a blister area. The area is tender on exam. There is no red streaking noted.   Neurological: She is alert and oriented to person, place, and time. No cranial nerve deficit.  Skin: Skin is warm and dry.  Insect bite  Psychiatric: She has a normal mood and affect. Her behavior is normal.  Nursing note and vitals reviewed.   ED Course  Procedures (including  critical care time) Discussed with Dr. Patria Maneampos Pepcid, Claritin Prednisone, hydrocodone for pain.  Labs Review  MDM  29 y.o. female with insect bite to the right upper leg. Stable for d/c without fever or red streaking. Will treat for local reaction and for possible infection. Hydrocodone, Claritin, Bactrim.  Patient will continue to take Benadryl as needed and apply ice pack to the area.  Discussed with the patient clinical findings and plan of care and all questioned fully answered. She will return if any problems arise.   Final diagnoses:  Insect bite of leg, infected, right, initial encounter       [facility] Center[facility] M Kline Bulthuis, NP 10/27/14 1630  Azalia BilisKevin Campos, MD 11/02/14 [phone]

## 2014-10-27 NOTE — ED Notes (Signed)
Complain of ? Insect bite to right knee

## 2014-10-28 ENCOUNTER — Emergency Department (HOSPITAL_COMMUNITY)
Admission: EM | Admit: 2014-10-28 | Discharge: 2014-10-29 | Disposition: A | Discharge status: Home / Self Care | Payer: Medicaid Other | Attending: Emergency Medicine | Admitting: Emergency Medicine

## 2014-10-28 ENCOUNTER — Encounter (HOSPITAL_COMMUNITY): Payer: Self-pay | Admitting: Emergency Medicine

## 2014-10-28 DIAGNOSIS — R519 Headache, unspecified: Secondary | ICD-10-CM

## 2014-10-28 DIAGNOSIS — S80861D Insect bite (nonvenomous), right lower leg, subsequent encounter: Secondary | ICD-10-CM | POA: Insufficient documentation

## 2014-10-28 DIAGNOSIS — Z720 Tobacco use: Secondary | ICD-10-CM | POA: Insufficient documentation

## 2014-10-28 DIAGNOSIS — Z8659 Personal history of other mental and behavioral disorders: Secondary | ICD-10-CM | POA: Insufficient documentation

## 2014-10-28 DIAGNOSIS — Z79899 Other long term (current) drug therapy: Secondary | ICD-10-CM | POA: Insufficient documentation

## 2014-10-28 DIAGNOSIS — R51 Headache: Secondary | ICD-10-CM | POA: Diagnosis not present

## 2014-10-28 DIAGNOSIS — M791 Myalgia, unspecified site: Secondary | ICD-10-CM

## 2014-10-28 DIAGNOSIS — Z3202 Encounter for pregnancy test, result negative: Secondary | ICD-10-CM | POA: Diagnosis not present

## 2014-10-28 DIAGNOSIS — X58XXXD Exposure to other specified factors, subsequent encounter: Secondary | ICD-10-CM | POA: Insufficient documentation

## 2014-10-28 DIAGNOSIS — Z8619 Personal history of other infectious and parasitic diseases: Secondary | ICD-10-CM | POA: Diagnosis not present

## 2014-10-28 DIAGNOSIS — R112 Nausea with vomiting, unspecified: Secondary | ICD-10-CM

## 2014-10-28 DIAGNOSIS — W57XXXA Bitten or stung by nonvenomous insect and other nonvenomous arthropods, initial encounter: Secondary | ICD-10-CM

## 2014-10-28 NOTE — ED Notes (Signed)
Patient c/o headache and body aches.

## 2014-10-28 NOTE — ED Notes (Signed)
Patient was seen yesterday for a bite to her leg.  Patient states she called ED and gave some symptoms and was told to come to ED.  Patient vomited x 1 when she arrived to ED.

## 2014-10-29 LAB — CBC WITH DIFFERENTIAL/PLATELET
Basophils Absolute: 0 10*3/uL (ref 0.0–0.1)
Basophils Relative: 0 % (ref 0–1)
EOS ABS: 0.1 10*3/uL (ref 0.0–0.7)
EOS PCT: 1 % (ref 0–5)
HCT: 37.7 % (ref 36.0–46.0)
Hemoglobin: 13.1 g/dL (ref 12.0–15.0)
LYMPHS ABS: 1.3 10*3/uL (ref 0.7–4.0)
Lymphocytes Relative: 19 % (ref 12–46)
MCH: 31.8 pg (ref 26.0–34.0)
MCHC: 34.7 g/dL (ref 30.0–36.0)
MCV: 91.5 fL (ref 78.0–100.0)
MONO ABS: 0.5 10*3/uL (ref 0.1–1.0)
MONOS PCT: 7 % (ref 3–12)
Neutro Abs: 5 10*3/uL (ref 1.7–7.7)
Neutrophils Relative %: 73 % (ref 43–77)
Platelets: 141 10*3/uL — ABNORMAL LOW (ref 150–400)
RBC: 4.12 MIL/uL (ref 3.87–5.11)
RDW: 12.4 % (ref 11.5–15.5)
WBC: 6.9 10*3/uL (ref 4.0–10.5)

## 2014-10-29 LAB — URINALYSIS, ROUTINE W REFLEX MICROSCOPIC
Bilirubin Urine: NEGATIVE
GLUCOSE, UA: NEGATIVE mg/dL
HGB URINE DIPSTICK: NEGATIVE
KETONES UR: NEGATIVE mg/dL
Leukocytes, UA: NEGATIVE
NITRITE: NEGATIVE
Protein, ur: NEGATIVE mg/dL
SPECIFIC GRAVITY, URINE: 1.025 (ref 1.005–1.030)
Urobilinogen, UA: 2 mg/dL — ABNORMAL HIGH (ref 0.0–1.0)
pH: 8.5 — ABNORMAL HIGH (ref 5.0–8.0)

## 2014-10-29 LAB — COMPREHENSIVE METABOLIC PANEL
ALBUMIN: 3.8 g/dL (ref 3.5–5.2)
ALT: 19 U/L (ref 0–35)
AST: 19 U/L (ref 0–37)
Alkaline Phosphatase: 56 U/L (ref 39–117)
Anion gap: 6 (ref 5–15)
BILIRUBIN TOTAL: 0.5 mg/dL (ref 0.3–1.2)
BUN: 10 mg/dL (ref 6–23)
CO2: 22 mmol/L (ref 19–32)
CREATININE: 0.54 mg/dL (ref 0.50–1.10)
Calcium: 8.3 mg/dL — ABNORMAL LOW (ref 8.4–10.5)
Chloride: 109 mmol/L (ref 96–112)
GFR calc Af Amer: >90 mL/min (ref >90)
GFR calc non Af Amer: >90 mL/min (ref >90)
Glucose, Bld: 99 mg/dL (ref 70–99)
Potassium: 3.8 mmol/L (ref 3.5–5.1)
SODIUM: 137 mmol/L (ref 135–145)
Total Protein: 6.2 g/dL (ref 6.0–8.3)

## 2014-10-29 LAB — POC URINE PREG, ED: Preg Test, Ur: NEGATIVE

## 2014-10-29 MED ORDER — DIPHENHYDRAMINE HCL 50 MG/ML IJ SOLN
50.0000 mg | Freq: Once | INTRAMUSCULAR | Status: AC
  Administered 2014-10-29: 50 mg via INTRAVENOUS
  Filled 2014-10-29: qty 1

## 2014-10-29 MED ORDER — DOXYCYCLINE HYCLATE 100 MG PO CAPS: 100.0000 mg | ORAL_CAPSULE | Freq: Two times a day (BID) | ORAL | Status: DC

## 2014-10-29 MED ORDER — SODIUM CHLORIDE 0.9 % IV BOLUS (SEPSIS)
1000.0000 mL | Freq: Once | INTRAVENOUS | Status: AC
Start: 2014-10-29 — End: 2014-10-29
  Administered 2014-10-29: 1000 mL via INTRAVENOUS

## 2014-10-29 MED ORDER — ONDANSETRON 4 MG PO TBDP: 4.0000 mg | ORAL_TABLET | Freq: Three times a day (TID) | ORAL | Status: DC | PRN

## 2014-10-29 MED ORDER — IBUPROFEN 800 MG PO TABS: 800.0000 mg | ORAL_TABLET | Freq: Three times a day (TID) | ORAL | Status: DC | PRN

## 2014-10-29 MED ORDER — KETOROLAC TROMETHAMINE 30 MG/ML IJ SOLN
30.0000 mg | Freq: Once | INTRAMUSCULAR | Status: AC
  Administered 2014-10-29: 30 mg via INTRAVENOUS
  Filled 2014-10-29: qty 1

## 2014-10-29 MED ORDER — METOCLOPRAMIDE HCL 5 MG/ML IJ SOLN
10.0000 mg | Freq: Once | INTRAMUSCULAR | Status: AC
  Administered 2014-10-29: 10 mg via INTRAVENOUS
  Filled 2014-10-29: qty 2

## 2014-10-29 NOTE — ED Provider Notes (Signed)
TIME SEEN: 12:15 AM  CHIEF COMPLAINT: Headache, myalgias, vomiting  HPI: Pt is a 29 y.o. female with history of depression who presents to the emergency department complaints of diffuse throbbing headache with photophobia, body aches, vomiting. Seen in the emergency department yesterday for a lesion to her right anterior knee. She thought she may have had a spider bite her but does not remember seeing an insect or feeling an insect bite her. She was discharged with hydrocodone, Claritin and Bactrim. States that the bite area has improved but now she is having systemic symptoms. They state that her discharge work told her to come back if she began having headaches, body aches. Denies any fever. No diarrhea. No abdominal pain. No other rash. No known tick bites. No recent travel. No known sick contacts.  ROS: See HPI Constitutional: no fever  Eyes: no drainage  ENT: no runny nose   Cardiovascular:  no chest pain  Resp: no SOB  GI:  vomiting GU: no dysuria Integumentary: no rash  Allergy: no hives  Musculoskeletal: no leg swelling  Neurological: no slurred speech ROS otherwise negative  PAST MEDICAL HISTORY/PAST SURGICAL HISTORY:  Past Medical History  Diagnosis Date  . Condyloma acuminatum   . Depression   . Abnormal Pap smear of cervix     MEDICATIONS:  Prior to Admission medications   Medication Sig Start Date End Date Taking? Authorizing Provider  HYDROcodone-acetaminophen (NORCO/VICODIN) 5-325 MG per tablet Take 1 tablet by mouth every 4 (four) hours as needed. 10/27/14   Hope Orlene OchM Neese, NP  loratadine (CLARITIN) 10 MG tablet Take 1 tablet (10 mg total) by mouth daily. 10/27/14   Hope Orlene OchM Neese, NP  norgestimate-ethinyl estradiol (ORTHO-CYCLEN,SPRINTEC,PREVIFEM) 0.25-35 MG-MCG tablet Take 1 tablet by mouth daily. 05/22/14   Tilda BurrowJohn Ferguson V, MD  sulfamethoxazole-trimethoprim (BACTRIM DS,SEPTRA DS) 800-160 MG per tablet Take 1 tablet by mouth 2 (two) times daily. 10/27/14 11/03/14  Hope Orlene OchM  Neese, NP    ALLERGIES:  No Known Allergies  SOCIAL HISTORY:  History  Substance Use Topics  . Smoking status: Current Every Day Smoker -- 1.00 packs/day for 8 years    Types: Cigarettes  . Smokeless tobacco: Never Used  . Alcohol Use: No    FAMILY HISTORY: Family History  Problem Relation Age of Onset  . Cancer Other     prostate   . Diabetes Other   . Arthritis Other   . Hypertension Other   . Hypertension Father     EXAM: BP 132/93 mmHg  Pulse 108  Temp(Src) 98.5 F (36.9 C) (Oral)  Resp 20  Ht 5\' 5"  (1.651 m)  Wt 125 lb (56.7 kg)  BMI 20.80 kg/m2  SpO2 99%  LMP 10/20/2014 CONSTITUTIONAL: Alert and oriented and responds appropriately to questions. Well-appearing; well-nourished, nontoxic, well-hydrated HEAD: Normocephalic EYES: Conjunctivae clear, PERRL, patient has photophobia, extra ocular movements intact ENT: normal nose; no rhinorrhea; moist mucous membranes; pharynx without lesions noted NECK: Supple, no meningismus, no LAD  CARD: RRR; S1 and S2 appreciated; no murmurs, no clicks, no rubs, no gallops RESP: Normal chest excursion without splinting or tachypnea; breath sounds clear and equal bilaterally; no wheezes, no rhonchi, no rales, no hypoxia or respiratory distress ABD/GI: Normal bowel sounds; non-distended; soft, non-tender, no rebound, no guarding BACK:  The back appears normal and is non-tender to palpation, there is no CVA tenderness EXT: Normal ROM in all joints; non-tender to palpation; no edema; normal capillary refill; no cyanosis    SKIN: Normal color for  age and race; warm; there is a 2 cm blister to the anterior knee with a surrounding circle of erythema but there is no warmth or induration or fluctuance, no drainage, no joint effusion, compartments are all soft NEURO: Moves all extremities equally, sensation to light touch intact diffusely, cranial nerves II through XII intact PSYCH: The patient's mood and manner are appropriate. Grooming and  personal hygiene are appropriate.  MEDICAL DECISION MAKING: Patient here with headache, body aches, vomiting. Had one episode of nonbloody nonbilious vomiting. Abdominal exam benign. No known tick bite but patient presenting with systemic symptoms after possible insect bite. Mother reports that the lesion on her knee has significantly improved. No sign of septic arthritis on exam. No signs of meningismus. No petechiae or purpura. No fever. We'll obtain labs, urine. Will give Toradol, Reglan, Benadryl, IV fluids and reassess.  ED PROGRESS: Patient's labs are unremarkable other than very mild thrombocytopenia. No hyponatremia or elevated LFTs to suggest tickborne illness but given systemic symptoms will have them stop Bactrim and start taking doxycycline which would cover for skin flora but also for any tickborne illness. Her headache is gone after above medications. She is not having any meningismus and again has a fever. I feel she is safe to be discharged home. Discussed return precautions with patient and family. They verbalize understanding and are comfortable with plan.     Layla Maw Landyn Buckalew, DO 10/29/14 [phone]

## 2014-10-29 NOTE — Discharge Instructions (Signed)
General Headache Without Cause A headache is pain or discomfort felt around the head or neck area. The specific cause of a headache may not be found. There are many causes and types of headaches. A few common ones are:  Tension headaches.  Migraine headaches.  Cluster headaches.  Chronic daily headaches. HOME CARE INSTRUCTIONS   Keep all follow-up appointments with your caregiver or any specialist referral.  Only take over-the-counter or prescription medicines for pain or discomfort as directed by your caregiver.  Lie down in a dark, quiet room when you have a headache.  Keep a headache journal to find out what may trigger your migraine headaches. For example, write down:  What you eat and drink.  How much sleep you get.  Any change to your diet or medicines.  Try massage or other relaxation techniques.  Put ice packs or heat on the head and neck. Use these 3 to 4 times per day for 15 to 20 minutes each time, or as needed.  Limit stress.  Sit up straight, and do not tense your muscles.  Quit smoking if you smoke.  Limit alcohol use.  Decrease the amount of caffeine you drink, or stop drinking caffeine.  Eat and sleep on a regular schedule.  Get 7 to 9 hours of sleep, or as recommended by your caregiver.  Keep lights dim if bright lights bother you and make your headaches worse. SEEK MEDICAL CARE IF:   You have problems with the medicines you were prescribed.  Your medicines are not working.  You have a change from the usual headache.  You have nausea or vomiting. SEEK IMMEDIATE MEDICAL CARE IF:   Your headache becomes severe.  You have a fever.  You have a stiff neck.  You have loss of vision.  You have muscular weakness or loss of muscle control.  You start losing your balance or have trouble walking.  You feel faint or pass out.  You have severe symptoms that are different from your first symptoms. MAKE SURE YOU:   Understand these  instructions.  Will watch your condition.  Will get help right away if you are not doing well or get worse. Document Released: 06/26/2005 Document Revised: 09/18/2011 Document Reviewed: 07/12/2011 [facility] Patient Information 2015 [place]. This information is not intended to replace advice given to you by your health care provider. Make sure you discuss any questions you have with your health care provider.  Insect Bite Mosquitoes, flies, fleas, bedbugs, and many other insects can bite. Insect bites are different from insect stings. A sting is when venom is injected into the skin. Some insect bites can transmit infectious diseases. SYMPTOMS  Insect bites usually turn red, swell, and itch for 2 to 4 days. They often go away on their own. TREATMENT  Your caregiver may prescribe antibiotic medicines if a bacterial infection develops in the bite. HOME CARE INSTRUCTIONS  Do not scratch the bite area.  Keep the bite area clean and dry. Wash the bite area thoroughly with soap and water.  Put ice or cool compresses on the bite area.  Put ice in a plastic bag.  Place a towel between your skin and the bag.  Leave the ice on for 20 minutes, 4 times a day for the first 2 to 3 days, or as directed.  You may apply a baking soda paste, cortisone cream, or calamine lotion to the bite area as directed by your caregiver. This can help reduce itching and swelling.  Only take over-the-counter or prescription medicines as directed by your caregiver.  If you are given antibiotics, take them as directed. Finish them even if you start to feel better. You may need a tetanus shot if:  You cannot remember when you had your last tetanus shot.  You have never had a tetanus shot.  The injury broke your skin. If you get a tetanus shot, your arm may swell, get red, and feel warm to the touch. This is common and not a problem. If you need a tetanus shot and you choose not to have one, there is a rare  chance of getting tetanus. Sickness from tetanus can be serious. SEEK IMMEDIATE MEDICAL CARE IF:   You have increased pain, redness, or swelling in the bite area.  You see a red line on the skin coming from the bite.  You have a fever.  You have joint pain.  You have a headache or neck pain.  You have unusual weakness.  You have a rash.  You have chest pain or shortness of breath.  You have abdominal pain, nausea, or vomiting.  You feel unusually tired or sleepy. MAKE SURE YOU:   Understand these instructions.  Will watch your condition.  Will get help right away if you are not doing well or get worse. Document Released: 08/03/2004 Document Revised: 09/18/2011 Document Reviewed: 01/25/2011 [facility] Patient Information 2015 [place], [place]. This information is not intended to replace advice given to you by your health care provider. Make sure you discuss any questions you have with your health care provider.

## 2015-02-24 ENCOUNTER — Other Ambulatory Visit: Payer: Self-pay | Admitting: Obstetrics and Gynecology

## 2015-03-17 ENCOUNTER — Ambulatory Visit: Payer: Medicaid Other | Admitting: Obstetrics and Gynecology

## 2015-06-28 ENCOUNTER — Encounter: Payer: Self-pay | Admitting: Obstetrics and Gynecology

## 2015-06-28 ENCOUNTER — Other Ambulatory Visit: Payer: Medicaid Other | Admitting: Obstetrics and Gynecology

## 2016-02-18 ENCOUNTER — Other Ambulatory Visit (HOSPITAL_COMMUNITY)
Admission: RE | Admit: 2016-02-18 | Discharge: 2016-02-18 | Disposition: A | Discharge status: Home / Self Care | Payer: Medicaid Other | Source: Ambulatory Visit | Attending: Obstetrics and Gynecology | Admitting: Obstetrics and Gynecology

## 2016-02-18 ENCOUNTER — Encounter: Payer: Self-pay | Admitting: Obstetrics and Gynecology

## 2016-02-18 ENCOUNTER — Ambulatory Visit (INDEPENDENT_AMBULATORY_CARE_PROVIDER_SITE_OTHER): Payer: Medicaid Other | Admitting: Obstetrics and Gynecology

## 2016-02-18 DIAGNOSIS — Z01419 Encounter for gynecological examination (general) (routine) without abnormal findings: Secondary | ICD-10-CM

## 2016-02-18 DIAGNOSIS — Z30011 Encounter for initial prescription of contraceptive pills: Secondary | ICD-10-CM

## 2016-02-18 DIAGNOSIS — Z0000 Encounter for general adult medical examination without abnormal findings: Secondary | ICD-10-CM | POA: Diagnosis not present

## 2016-02-18 DIAGNOSIS — Z1151 Encounter for screening for human papillomavirus (HPV): Secondary | ICD-10-CM | POA: Diagnosis not present

## 2016-02-18 NOTE — Progress Notes (Signed)
Patient ID: Dorothy Strong, female   DOB: [birth year], 30 y.o.   MRN: [id number]   Assessment:  Annual Gyn Exam   Plan:  1. pap smear done, next pap due in 1 year 2. return annually or prn 3    Annual mammogram and regular self exams advised 4. Results via MyChart  5. Refill Sprintec  Subjective:   Chief Complaint  Patient presents with  . pap and physical    annual exam      Dorothy Strong is a 30 y.o. female G1P1001 who presents for annual exam. No LMP recorded. The patient has no complaints today. She reports she is currently taking oral contraceptives. She is a current smoker, 1 ppd.   The following portions of the patient's history were reviewed and updated as appropriate: allergies, current medications, past family history, past medical history, past social history, past surgical history and problem list. Past Medical History:  Diagnosis Date  . Abnormal Pap smear of cervix   . Condyloma acuminatum   . Depression     Past Surgical History:  Procedure Laterality Date  . ADENOIDECTOMY    . CERVICAL CONIZATION W/BX N/A 02/17/2014   Procedure: CONIZATION CERVIX WITH BIOPSY;  Surgeon: Tilda BurrowJohn Berkeley Vanaken V, MD;  Location: [facility];  Service: Gynecology;  Laterality: N/A;  . COLPOSCOPY    . TONSILLECTOMY       Current Outpatient Prescriptions:  .  SPRINTEC 28 0.25-35 MG-MCG tablet, TAKE ONE TABLET BY MOUTH DAILY., Disp: 28 tablet, Rfl: 12  Review of Systems Constitutional: negative Gastrointestinal: negative Genitourinary: negative  Objective:  There were no vitals taken for this visit.   BMI: There is no height or weight on file to calculate BMI.  General Appearance: Alert, appropriate appearance for age. No acute distress HEENT: Grossly normal Neck / Thyroid:  Cardiovascular: RRR; normal S1, S2, no murmur Lungs: CTA bilaterally Back: No CVAT Breast Exam: No masses or nodes.No dimpling, nipple retraction or discharge. Breast tissues even.  Gastrointestinal: Soft,  non-tender, no masses or organomegaly Pelvic Exam:  External genitalia: normal general appearance, small simple sebaceous cyst to the right labia minora  Vaginal: normal mucosa without prolapse or lesions Cervix: normal appearance Adnexa: normal bimanual exam Uterus: normal single, nontender, tiny, anterior, mobile  Rectovaginal: not indicated Lymphatic Exam: Non-palpable nodes in neck, clavicular, axillary, or inguinal regions  Skin: no rash or abnormalities Neurologic: Normal gait and speech, no tremor  Psychiatric: Alert and oriented, appropriate affect.  Urinalysis:Not done  Christin BachJohn Alvia Jablonski. MD Pgr [phone] 11:17 AM    By signing my name below, I, Doreatha MartinEva Mathews, attest that this documentation has been prepared under the direction and in the presence of Tilda BurrowJohn V Blessed Girdner, MD. Electronically Signed: Doreatha MartinEva Mathews, ED Scribe. 02/18/16. 11:17 AM.  I personally performed the services described in this documentation, which was SCRIBED in my presence. The recorded information has been reviewed and considered accurate. It has been edited as necessary during review. Tilda BurrowFERGUSON,Malakie Balis V, MD

## 2016-02-22 LAB — CYTOLOGY - PAP

## 2016-02-22 MED ORDER — NORGESTIMATE-ETH ESTRADIOL 0.25-35 MG-MCG PO TABS: 1.0000 | ORAL_TABLET | Freq: Every day | ORAL | 12 refills | Status: DC

## 2016-02-27 ENCOUNTER — Other Ambulatory Visit: Payer: Self-pay | Admitting: Obstetrics and Gynecology

## 2016-02-27 DIAGNOSIS — A5901 Trichomonal vulvovaginitis: Secondary | ICD-10-CM

## 2016-02-27 MED ORDER — METRONIDAZOLE 500 MG PO TABS: 2000.0000 mg | ORAL_TABLET | Freq: Once | ORAL | 1 refills | Status: AC

## 2016-02-27 NOTE — Progress Notes (Signed)
Pt noted to have trichomonas on pap. Tx for pt and partner called in via Esg

## 2016-02-28 ENCOUNTER — Telehealth: Payer: Self-pay | Admitting: *Deleted

## 2016-02-28 ENCOUNTER — Telehealth: Payer: Self-pay | Admitting: Adult Health

## 2016-02-28 MED ORDER — METRONIDAZOLE 500 MG PO TABS: 500.0000 mg | ORAL_TABLET | Freq: Two times a day (BID) | ORAL | 0 refills | Status: DC

## 2016-02-28 NOTE — Telephone Encounter (Signed)
-----   Message from Criss Alvinehrystal G Pulliam, RN sent at 02/28/2016  3:26 PM EDT ----- Victorino DikeJennifer,  Would you prescribed treatment for Trich? Dr. Emelda FearFerguson routed the message to me to contact pt and noted he had prescribed Flagyl but did not see Rx in chart.  Thank you ----- Message ----- From: Tilda BurrowJohn V Ferguson, MD Sent: 02/27/2016   8:48 PM To: Criss Alvinehrystal G Pulliam, RN  Trichomonas on pap smear , identified on report. Will tx with flagyl, and tx partner (expedited therapy). Please call pt . Rx flagyl placed

## 2016-02-28 NOTE — Telephone Encounter (Signed)
Unable to reach by phone will rx flagyl for pt but will need partners name and DOB

## 2016-02-28 NOTE — Telephone Encounter (Signed)
-----   Message from Chrystal G Pulliam, RN sent at 02/28/2016  3:26 PM EDT ----- Jennifer,  Would you prescribed treatment for Trich? Dr. Ferguson routed the message to me to contact pt and noted he had prescribed Flagyl but did not see Rx in chart.  Thank you ----- Message ----- From: John V Ferguson, MD Sent: 02/27/2016   8:48 PM To: Chrystal G Pulliam, RN  Trichomonas on pap smear , identified on report. Will tx with flagyl, and tx partner (expedited therapy). Please call pt . Rx flagyl placed  

## 2016-02-28 NOTE — Telephone Encounter (Signed)
Pt called back and was informed that Flagyl was sent to her pharmacy, we need partners information for him to be treated as well.  Pt states she will get in touch with partner and have him call us directlly.  Pt informed to take medication and have partner get treated, no sex until proof of cure and she needs to return in about 3 weeks for POC, pt verbalized understanding.

## 2016-02-28 NOTE — Telephone Encounter (Signed)
Pt informed of Trich + on Pap from 02/18/2016, [facility], no sex until POC x 3 weeks. Pt verbalized understanding.

## 2016-02-28 NOTE — Telephone Encounter (Signed)
Left message to call me back.

## 2016-04-10 ENCOUNTER — Ambulatory Visit: Payer: Medicaid Other | Admitting: Women's Health

## 2016-04-13 ENCOUNTER — Ambulatory Visit: Payer: Medicaid Other | Admitting: Women's Health

## 2016-04-14 ENCOUNTER — Ambulatory Visit (INDEPENDENT_AMBULATORY_CARE_PROVIDER_SITE_OTHER): Payer: Medicaid Other | Admitting: Women's Health

## 2016-04-14 ENCOUNTER — Encounter: Payer: Self-pay | Admitting: Women's Health

## 2016-04-14 DIAGNOSIS — A599 Trichomoniasis, unspecified: Secondary | ICD-10-CM

## 2016-04-14 DIAGNOSIS — R102 Pelvic and perineal pain: Secondary | ICD-10-CM | POA: Diagnosis not present

## 2016-04-14 LAB — POCT WET PREP (WET MOUNT)
Clue Cells Wet Prep Whiff POC: NEGATIVE
Trichomonas Wet Prep HPF POC: ABSENT

## 2016-04-14 NOTE — Progress Notes (Signed)
   [facility] Visit  Patient name: Dorothy Saxmily A Marriott MRN [id number]  Date of birth: December 27, 1985  CC & HPI:  Dorothy Strong is a 30 y.o. 821P1001 Caucasian female presenting today for trichomonas poc. Was + on 02/18/16 pap, took meds as directed, partner also got tx w/ his md. Still cramping some, don't see a recent gc/ct.  Patient's last menstrual period was 04/12/2016. Pertinent History Reviewed:  Medical & Surgical Hx:   Past medical, surgical, family, and social history reviewed in electronic medical record Medications: Reviewed & Updated - see associated section Allergies: Reviewed in electronic medical record  Objective Findings:  Vitals: LMP 04/12/2016  There is no height or weight on file to calculate BMI.  Physical Examination: General appearance - alert, well appearing, and in no distress Pelvic - cx clear, scant nonodorous d/c  No results found for this or any previous visit (from the past 24 hour(s)).   Assessment & Plan:  A:   Trich poc- neg  Pelvic cramping  P:  GC/CT from urine today  Return for Aug for pap/physical.  Marge DuncansBooker, Ramatoulaye Pack Randall CNM, [facility]NP-BC 04/14/2016 11:31 AM

## 2016-04-18 LAB — GC/CHLAMYDIA PROBE AMP
CHLAMYDIA, DNA PROBE: NEGATIVE
NEISSERIA GONORRHOEAE BY PCR: NEGATIVE

## 2016-07-28 ENCOUNTER — Ambulatory Visit: Payer: Medicaid Other | Admitting: Adult Health

## 2017-02-23 ENCOUNTER — Other Ambulatory Visit: Payer: Self-pay | Admitting: Obstetrics and Gynecology

## 2017-02-26 ENCOUNTER — Encounter: Payer: Self-pay | Admitting: *Deleted

## 2017-02-26 NOTE — Telephone Encounter (Signed)
refil sprintec x 2 months. Will send recall

## 2017-07-31 ENCOUNTER — Other Ambulatory Visit: Payer: Self-pay | Admitting: Obstetrics and Gynecology

## 2017-08-01 NOTE — Telephone Encounter (Signed)
Will send patient recall request

## 2017-10-03 ENCOUNTER — Other Ambulatory Visit: Payer: Self-pay | Admitting: Obstetrics and Gynecology

## 2017-12-04 ENCOUNTER — Other Ambulatory Visit: Payer: Self-pay | Admitting: Obstetrics and Gynecology

## 2021-11-08 ENCOUNTER — Emergency Department (HOSPITAL_COMMUNITY): Payer: Medicaid Other

## 2021-11-08 ENCOUNTER — Encounter (HOSPITAL_COMMUNITY): Payer: Self-pay

## 2021-11-08 ENCOUNTER — Emergency Department (HOSPITAL_COMMUNITY)
Admission: EM | Admit: 2021-11-08 | Discharge: 2021-11-08 | Disposition: A | Discharge status: Home / Self Care | Payer: Medicaid Other | Attending: Emergency Medicine | Admitting: Emergency Medicine

## 2021-11-08 ENCOUNTER — Other Ambulatory Visit: Payer: Self-pay

## 2021-11-08 DIAGNOSIS — R350 Frequency of micturition: Secondary | ICD-10-CM | POA: Insufficient documentation

## 2021-11-08 DIAGNOSIS — R109 Unspecified abdominal pain: Secondary | ICD-10-CM | POA: Insufficient documentation

## 2021-11-08 DIAGNOSIS — R110 Nausea: Secondary | ICD-10-CM | POA: Diagnosis not present

## 2021-11-08 DIAGNOSIS — M549 Dorsalgia, unspecified: Secondary | ICD-10-CM | POA: Diagnosis not present

## 2021-11-08 DIAGNOSIS — M545 Low back pain, unspecified: Secondary | ICD-10-CM | POA: Diagnosis not present

## 2021-11-08 DIAGNOSIS — R1030 Lower abdominal pain, unspecified: Secondary | ICD-10-CM | POA: Diagnosis not present

## 2021-11-08 LAB — URINALYSIS, ROUTINE W REFLEX MICROSCOPIC
Bilirubin Urine: NEGATIVE
Glucose, UA: NEGATIVE mg/dL
Hgb urine dipstick: NEGATIVE
Ketones, ur: NEGATIVE mg/dL
Leukocytes,Ua: NEGATIVE
Nitrite: NEGATIVE
Protein, ur: NEGATIVE mg/dL
Specific Gravity, Urine: 1.016 (ref 1.005–1.030)
pH: 6 (ref 5.0–8.0)

## 2021-11-08 LAB — CBC WITH DIFFERENTIAL/PLATELET
Abs Immature Granulocytes: 0.01 10*3/uL (ref 0.00–0.07)
Basophils Absolute: 0.1 10*3/uL (ref 0.0–0.1)
Basophils Relative: 1 %
Eosinophils Absolute: 0.1 10*3/uL (ref 0.0–0.5)
Eosinophils Relative: 1 %
HCT: 43.4 % (ref 36.0–46.0)
Hemoglobin: 16.1 g/dL — ABNORMAL HIGH (ref 12.0–15.0)
Immature Granulocytes: 0 %
Lymphocytes Relative: 42 %
Lymphs Abs: 3.1 10*3/uL (ref 0.7–4.0)
MCH: 32.1 pg (ref 26.0–34.0)
MCHC: 37.1 g/dL — ABNORMAL HIGH (ref 30.0–36.0)
MCV: 86.5 fL (ref 80.0–100.0)
Monocytes Absolute: 0.7 10*3/uL (ref 0.1–1.0)
Monocytes Relative: 10 %
Neutro Abs: 3.4 10*3/uL (ref 1.7–7.7)
Neutrophils Relative %: 46 %
Platelets: 246 10*3/uL (ref 150–400)
RBC: 5.02 MIL/uL (ref 3.87–5.11)
RDW: 13.8 % (ref 11.5–15.5)
WBC: 7.4 10*3/uL (ref 4.0–10.5)
nRBC: 0 % (ref 0.0–0.2)

## 2021-11-08 LAB — TSH: TSH: 0.787 u[IU]/mL (ref 0.350–4.500)

## 2021-11-08 LAB — COMPREHENSIVE METABOLIC PANEL
ALT: 15 U/L (ref 0–44)
AST: 15 U/L (ref 15–41)
Albumin: 4.5 g/dL (ref 3.5–5.0)
Alkaline Phosphatase: 95 U/L (ref 38–126)
Anion gap: 6 (ref 5–15)
BUN: 13 mg/dL (ref 6–20)
CO2: 25 mmol/L (ref 22–32)
Calcium: 9.1 mg/dL (ref 8.9–10.3)
Chloride: 107 mmol/L (ref 98–111)
Creatinine, Ser: 0.51 mg/dL (ref 0.44–1.00)
GFR, Estimated: >60 mL/min (ref >60)
Glucose, Bld: 99 mg/dL (ref 70–99)
Potassium: 3.8 mmol/L (ref 3.5–5.1)
Sodium: 138 mmol/L (ref 135–145)
Total Bilirubin: 1.8 mg/dL — ABNORMAL HIGH (ref 0.3–1.2)
Total Protein: 7.5 g/dL (ref 6.5–8.1)

## 2021-11-08 LAB — POC URINE PREG, ED: Preg Test, Ur: NEGATIVE

## 2021-11-08 MED ORDER — MELOXICAM 7.5 MG PO TABS: 7.5000 mg | ORAL_TABLET | Freq: Every day | ORAL | 0 refills | Status: DC

## 2021-11-08 MED ORDER — PHENAZOPYRIDINE HCL 100 MG PO TABS
200.0000 mg | ORAL_TABLET | Freq: Once | ORAL | Status: AC
  Administered 2021-11-08: 200 mg via ORAL
  Filled 2021-11-08: qty 2

## 2021-11-08 MED ORDER — PHENAZOPYRIDINE HCL 200 MG PO TABS: 200.0000 mg | ORAL_TABLET | Freq: Three times a day (TID) | ORAL | 0 refills | Status: DC

## 2021-11-08 NOTE — ED Notes (Signed)
Patient transported to CT 

## 2021-11-08 NOTE — Discharge Instructions (Addendum)
I recommend taking the medications prescribed to see if this will help with your pain symptoms.  As discussed, your lab tests and imaging today are reassuring as we can find no emergent need for hospital admission today.  Hopefully the medications prescribed will relieve your symptoms.  As discussed I have given you referrals for locating a primary medical doctor.  You may also benefit from grief counseling as discussed, I have given you several referrals for this as well you may need to make several phone calls to find the right fit. ?

## 2021-11-08 NOTE — ED Triage Notes (Signed)
Patient with complaints of back pain that started 2 days prior that has gotten worse. She also has lower abdominal pain with nausea and headache.  ?

## 2021-11-08 NOTE — ED Provider Notes (Signed)
?[facility] ?Provider Note ? ? ?CSN: [id number] ?Arrival date & time: 11/08/21  [id number] ? ?  ? ?History ? ?Chief Complaint  ?Patient presents with  ? Back Pain  ? ? ?Dorothy Strong is a 36 y.o. female with no significant past medical history presenting with multiple complaints.  She describes an approximate 2-day history of right lower back discomfort which has been constant in association with bladder pressure sensation but denies frank dysuria, hematuria.  She does report frequent urination at baseline which has not worsened with today's symptoms.  She does not have a history of kidney stones.  She has had some nausea, no vomiting, no fevers or chills.  She also denies chest pain or shortness of breath.  Of note, we discussed her elevated pulse rate today and she states that she always has an elevated pulse, states she has been diagnosed with SVT in the past but also states since the birth of her last child who is 30 years old she has been unable to gain weight, also reports increasing anxiety along with difficulty sleeping.  She does not have a current PCP.   ? ?She also describes feeling angry currently.  Her significant other died unexpectedly 1 month ago and she is still grieving from this loss.  She has not had any grief counseling.  She denies SI/HI. ? ?The history is provided by the patient.  ? ?  ? ?Home Medications ?Prior to Admission medications   ?Medication Sig Start Date End Date Taking? Authorizing Provider  ?meloxicam (MOBIC) 7.5 MG tablet Take 1 tablet (7.5 mg total) by mouth daily. 11/08/21  Yes Burns Timson, Almyra Free, PA-C  ?phenazopyridine (PYRIDIUM) 200 MG tablet Take 1 tablet (200 mg total) by mouth 3 (three) times daily. 11/08/21  Yes Lyllian Gause, Almyra Free, PA-C  ?metroNIDAZOLE (FLAGYL) 500 MG tablet Take 1 tablet (500 mg total) by mouth 2 (two) times daily. ?Patient not taking: Reported on 04/14/2016 02/28/16   Estill Dooms, NP  ?[place] 28 0.25-35 MG-MCG tablet TAKE 1 TABLET BY MOUTH EVERY DAY  12/05/17   Jonnie Kind, MD  ?   ? ?Allergies    ?Patient has no known allergies.   ? ?Review of Systems   ?Review of Systems  ?Constitutional:  Negative for fever.  ?HENT:  Negative for congestion and sore throat.   ?Eyes: Negative.   ?Respiratory:  Negative for chest tightness and shortness of breath.   ?Cardiovascular:  Positive for palpitations. Negative for chest pain.  ?Gastrointestinal:  Positive for nausea. Negative for abdominal pain and vomiting.  ?Genitourinary:  Positive for flank pain and frequency. Negative for hematuria, menstrual problem, pelvic pain and vaginal discharge.  ?Musculoskeletal:  Negative for arthralgias, joint swelling and neck pain.  ?Skin: Negative.  Negative for rash and wound.  ?Neurological:  Negative for dizziness, weakness, light-headedness, numbness and headaches.  ?Psychiatric/Behavioral:  The patient is nervous/anxious.   ?All other systems reviewed and are negative. ? ?Physical Exam ?Updated Vital Signs ?BP (!) 133/96   Pulse 99   Temp 97.6 ?F (36.4 ?C) (Oral)   Resp 18   Ht 5\' 5"  (1.651 m)   Wt 52.2 kg   LMP  (LMP Unknown)   SpO2 98%   BMI 19.14 kg/m?  ?Physical Exam ?Vitals and nursing note reviewed.  ?Constitutional:   ?   Appearance: Normal appearance. She is well-developed.  ?HENT:  ?   Head: Normocephalic and atraumatic.  ?Eyes:  ?   Conjunctiva/sclera: Conjunctivae normal.  ?Cardiovascular:  ?  Rate and Rhythm: Normal rate and regular rhythm.  ?   Heart sounds: Normal heart sounds.  ?Pulmonary:  ?   Effort: Pulmonary effort is normal.  ?   Breath sounds: Normal breath sounds. No wheezing.  ?Abdominal:  ?   General: Bowel sounds are normal.  ?   Palpations: Abdomen is soft.  ?   Tenderness: There is no abdominal tenderness.  ?Musculoskeletal:     ?   General: Normal range of motion.  ?   Cervical back: Normal range of motion.  ?Skin: ?   General: Skin is warm and dry.  ?Neurological:  ?   General: No focal deficit present.  ?   Mental Status: She is alert  and oriented to person, place, and time.  ?Psychiatric:     ?   Mood and Affect: Mood normal.     ?   Judgment: Judgment normal.  ? ? ?ED Results / Procedures / Treatments   ?Labs ?(all labs ordered are listed, but only abnormal results are displayed) ?Labs Reviewed  ?CBC WITH DIFFERENTIAL/PLATELET - Abnormal; Notable for the following components:  ?    Result Value  ? Hemoglobin 16.1 (*)   ? MCHC 37.1 (*)   ? All other components within normal limits  ?COMPREHENSIVE METABOLIC PANEL - Abnormal; Notable for the following components:  ? Total Bilirubin 1.8 (*)   ? All other components within normal limits  ?URINALYSIS, ROUTINE W REFLEX MICROSCOPIC  ?TSH  ?POC URINE PREG, ED  ? ? ?EKG ?None ? ?Radiology ?No results found. ?Results for orders placed or performed during the hospital encounter of 11/08/21  ?CBC with Differential  ?Result Value Ref Range  ? WBC 7.4 4.0 - 10.5 K/uL  ? RBC 5.02 3.87 - 5.11 MIL/uL  ? Hemoglobin 16.1 (H) 12.0 - 15.0 g/dL  ? HCT 43.4 36.0 - 46.0 %  ? MCV 86.5 80.0 - 100.0 fL  ? MCH 32.1 26.0 - 34.0 pg  ? MCHC 37.1 (H) 30.0 - 36.0 g/dL  ? RDW 13.8 11.5 - 15.5 %  ? Platelets 246 150 - 400 K/uL  ? nRBC 0.0 0.0 - 0.2 %  ? Neutrophils Relative % 46 %  ? Neutro Abs 3.4 1.7 - 7.7 K/uL  ? Lymphocytes Relative 42 %  ? Lymphs Abs 3.1 0.7 - 4.0 K/uL  ? Monocytes Relative 10 %  ? Monocytes Absolute 0.7 0.1 - 1.0 K/uL  ? Eosinophils Relative 1 %  ? Eosinophils Absolute 0.1 0.0 - 0.5 K/uL  ? Basophils Relative 1 %  ? Basophils Absolute 0.1 0.0 - 0.1 K/uL  ? Immature Granulocytes 0 %  ? Abs Immature Granulocytes 0.01 0.00 - 0.07 K/uL  ?Comprehensive metabolic panel  ?Result Value Ref Range  ? Sodium 138 135 - 145 mmol/L  ? Potassium 3.8 3.5 - 5.1 mmol/L  ? Chloride 107 98 - 111 mmol/L  ? CO2 25 22 - 32 mmol/L  ? Glucose, Bld 99 70 - 99 mg/dL  ? BUN 13 6 - 20 mg/dL  ? Creatinine, Ser 0.51 0.44 - 1.00 mg/dL  ? Calcium 9.1 8.9 - 10.3 mg/dL  ? Total Protein 7.5 6.5 - 8.1 g/dL  ? Albumin 4.5 3.5 - 5.0 g/dL  ? AST  15 15 - 41 U/L  ? ALT 15 0 - 44 U/L  ? Alkaline Phosphatase 95 38 - 126 U/L  ? Total Bilirubin 1.8 (H) 0.3 - 1.2 mg/dL  ? GFR, Estimated >60 >60 mL/min  ? Anion  gap 6 5 - 15  ?Urinalysis, Routine w reflex microscopic Urine, Clean Catch  ?Result Value Ref Range  ? Color, Urine YELLOW YELLOW  ? APPearance CLEAR CLEAR  ? Specific Gravity, Urine 1.016 1.005 - 1.030  ? pH 6.0 5.0 - 8.0  ? Glucose, UA NEGATIVE NEGATIVE mg/dL  ? Hgb urine dipstick NEGATIVE NEGATIVE  ? Bilirubin Urine NEGATIVE NEGATIVE  ? Ketones, ur NEGATIVE NEGATIVE mg/dL  ? Protein, ur NEGATIVE NEGATIVE mg/dL  ? Nitrite NEGATIVE NEGATIVE  ? Leukocytes,Ua NEGATIVE NEGATIVE  ?TSH  ?Result Value Ref Range  ? TSH 0.787 0.350 - 4.500 uIU/mL  ?POC Urine Pregnancy, ED (not at [facility])  ?Result Value Ref Range  ? Preg Test, Ur NEGATIVE NEGATIVE  ? ?CT Renal Stone Study ? ?Result Date: 11/08/2021 ?CLINICAL DATA:  Back pain starting 2 days ago getting worse, lower abdominal pain with nausea and headache EXAM: CT ABDOMEN AND PELVIS WITHOUT CONTRAST TECHNIQUE: Multidetector CT imaging of the abdomen and pelvis was performed following the standard protocol without IV contrast. RADIATION DOSE REDUCTION: This exam was performed according to the departmental dose-optimization program which includes automated exposure control, adjustment of the mA and/or kV according to patient size and/or use of iterative reconstruction technique. COMPARISON:  CT abdomen/pelvis 04/17/2014 FINDINGS: Lower chest: Lung bases are clear. The imaged heart is unremarkable. Hepatobiliary: The liver and gallbladder are unremarkable. There is no biliary ductal dilatation. Pancreas: Unremarkable. Spleen: Unremarkable. Adrenals/Urinary Tract: The adrenals are unremarkable. The kidneys are unremarkable, with no focal lesion (within the confines of noncontrast technique), stone, hydronephrosis, or hydroureter. No stone is seen along the course of either ureter. The bladder is decompressed but grossly  unremarkable. Stomach/Bowel: The stomach is grossly unremarkable. There is no evidence of bowel obstruction. There is no abnormal bowel wall thickening or inflammatory change. At least a portion of the appendix is id

## 2021-12-05 ENCOUNTER — Emergency Department (HOSPITAL_COMMUNITY): Payer: Medicaid Other

## 2021-12-05 ENCOUNTER — Encounter (HOSPITAL_COMMUNITY): Payer: Self-pay | Admitting: *Deleted

## 2021-12-05 ENCOUNTER — Emergency Department (HOSPITAL_COMMUNITY)
Admission: EM | Admit: 2021-12-05 | Discharge: 2021-12-05 | Disposition: A | Discharge status: Home / Self Care | Payer: Medicaid Other | Attending: Emergency Medicine | Admitting: Emergency Medicine

## 2021-12-05 ENCOUNTER — Other Ambulatory Visit: Payer: Self-pay

## 2021-12-05 DIAGNOSIS — S3992XA Unspecified injury of lower back, initial encounter: Secondary | ICD-10-CM | POA: Diagnosis present

## 2021-12-05 DIAGNOSIS — S39012A Strain of muscle, fascia and tendon of lower back, initial encounter: Secondary | ICD-10-CM | POA: Insufficient documentation

## 2021-12-05 DIAGNOSIS — N9489 Other specified conditions associated with female genital organs and menstrual cycle: Secondary | ICD-10-CM | POA: Diagnosis not present

## 2021-12-05 DIAGNOSIS — M545 Low back pain, unspecified: Secondary | ICD-10-CM | POA: Diagnosis not present

## 2021-12-05 LAB — I-STAT BETA HCG BLOOD, ED (MC, WL, AP ONLY): I-stat hCG, quantitative: <5 m[IU]/mL (ref <5)

## 2021-12-05 MED ORDER — OXYCODONE-ACETAMINOPHEN 5-325 MG PO TABS
2.0000 | ORAL_TABLET | Freq: Once | ORAL | Status: AC
  Administered 2021-12-05: 2 via ORAL
  Filled 2021-12-05: qty 2

## 2021-12-05 MED ORDER — METHOCARBAMOL 500 MG PO TABS: 500.0000 mg | ORAL_TABLET | Freq: Two times a day (BID) | ORAL | 0 refills | Status: DC

## 2021-12-05 MED ORDER — PREDNISONE 20 MG PO TABS: 40.0000 mg | ORAL_TABLET | Freq: Every day | ORAL | 0 refills | Status: DC
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
## 2021-12-05 NOTE — Discharge Instructions (Addendum)
Please use Tylenol or ibuprofen for pain.  You may use 600-800 mg ibuprofen every 6 hours or 1000 mg of Tylenol every 6 hours.  You may choose to alternate between the 2.  This would be most effective.  Not to exceed 4 g of Tylenol within 24 hours.  Not to exceed 3200 mg ibuprofen 24 hours. ? ?You can use the Robaxin I am prescribing up to twice daily.  Be cautious as this medication can make you drowsy.  It is safe to take at night but you may want to exercise caution before piloting a motor vehicle or operating any heavy machinery. ? ?You can apply some ice or heat directly to the area where it hurts.  You may benefit from chiropractic adjustment if you would like to try something.  I recommend that you do some rehab exercises as discussed in the attachments I provided. ? ?If you have worsening pain, new numbness, inability to urinate or defecate please return to the emergency department.  If your pain persists despite treatment I recommend that you follow-up with orthopedics for further evaluation. ?
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
## 2021-12-06 ENCOUNTER — Telehealth: Payer: Self-pay

## 2021-12-06 NOTE — Telephone Encounter (Signed)
Transition Care Management Unsuccessful Follow-up Telephone Call  Date of discharge and from where:  12/05/2021-[place]   Attempts:  1st Attempt  Reason for unsuccessful TCM follow-up call:  Unable to leave message

## 2021-12-07 NOTE — Telephone Encounter (Signed)
Transition Care Management Unsuccessful Follow-up Telephone Call  Date of discharge and from where:  12/05/2021-[place]   Attempts:  2nd Attempt  Reason for unsuccessful TCM follow-up call:  Unable to reach patient

## 2021-12-08 NOTE — Telephone Encounter (Signed)
Transition Care Management Unsuccessful Follow-up Telephone Call  Date of discharge and from where:  12/05/2021-[place]   Attempts:  3rd Attempt  Reason for unsuccessful TCM follow-up call:  Unable to reach patient

## 2021-12-21 ENCOUNTER — Encounter (HOSPITAL_COMMUNITY): Payer: Self-pay | Admitting: *Deleted

## 2021-12-21 ENCOUNTER — Emergency Department (HOSPITAL_COMMUNITY): Payer: Medicaid Other

## 2021-12-21 ENCOUNTER — Other Ambulatory Visit: Payer: Self-pay

## 2021-12-21 ENCOUNTER — Inpatient Hospital Stay (HOSPITAL_COMMUNITY): Payer: Medicaid Other

## 2021-12-21 ENCOUNTER — Inpatient Hospital Stay (HOSPITAL_COMMUNITY)
Admission: EM | Admit: 2021-12-21 | Discharge: 2021-12-24 | DRG: 872 | Disposition: A | Discharge status: Home / Self Care | Payer: Medicaid Other | Attending: Family Medicine | Admitting: Family Medicine

## 2021-12-21 DIAGNOSIS — Z791 Long term (current) use of non-steroidal anti-inflammatories (NSAID): Secondary | ICD-10-CM

## 2021-12-21 DIAGNOSIS — F1310 Sedative, hypnotic or anxiolytic abuse, uncomplicated: Secondary | ICD-10-CM | POA: Diagnosis present

## 2021-12-21 DIAGNOSIS — F1910 Other psychoactive substance abuse, uncomplicated: Secondary | ICD-10-CM

## 2021-12-21 DIAGNOSIS — F17210 Nicotine dependence, cigarettes, uncomplicated: Secondary | ICD-10-CM | POA: Diagnosis present

## 2021-12-21 DIAGNOSIS — F1210 Cannabis abuse, uncomplicated: Secondary | ICD-10-CM | POA: Diagnosis present

## 2021-12-21 DIAGNOSIS — Z8042 Family history of malignant neoplasm of prostate: Secondary | ICD-10-CM

## 2021-12-21 DIAGNOSIS — Z833 Family history of diabetes mellitus: Secondary | ICD-10-CM

## 2021-12-21 DIAGNOSIS — L0291 Cutaneous abscess, unspecified: Secondary | ICD-10-CM | POA: Diagnosis not present

## 2021-12-21 DIAGNOSIS — Z8261 Family history of arthritis: Secondary | ICD-10-CM

## 2021-12-21 DIAGNOSIS — Z8249 Family history of ischemic heart disease and other diseases of the circulatory system: Secondary | ICD-10-CM | POA: Diagnosis not present

## 2021-12-21 DIAGNOSIS — A419 Sepsis, unspecified organism: Secondary | ICD-10-CM | POA: Diagnosis present

## 2021-12-21 DIAGNOSIS — F1110 Opioid abuse, uncomplicated: Secondary | ICD-10-CM | POA: Diagnosis present

## 2021-12-21 DIAGNOSIS — L02413 Cutaneous abscess of right upper limb: Secondary | ICD-10-CM | POA: Diagnosis not present

## 2021-12-21 LAB — CBC WITH DIFFERENTIAL/PLATELET
Abs Immature Granulocytes: 0.08 10*3/uL — ABNORMAL HIGH (ref 0.00–0.07)
Basophils Absolute: 0.1 10*3/uL (ref 0.0–0.1)
Basophils Relative: 0 %
Eosinophils Absolute: 0.1 10*3/uL (ref 0.0–0.5)
Eosinophils Relative: 1 %
HCT: 38.5 % (ref 36.0–46.0)
Hemoglobin: 13.1 g/dL (ref 12.0–15.0)
Immature Granulocytes: 1 %
Lymphocytes Relative: 13 %
Lymphs Abs: 1.8 10*3/uL (ref 0.7–4.0)
MCH: 30.8 pg (ref 26.0–34.0)
MCHC: 34 g/dL (ref 30.0–36.0)
MCV: 90.4 fL (ref 80.0–100.0)
Monocytes Absolute: 1.3 10*3/uL — ABNORMAL HIGH (ref 0.1–1.0)
Monocytes Relative: 9 %
Neutro Abs: 10.2 10*3/uL — ABNORMAL HIGH (ref 1.7–7.7)
Neutrophils Relative %: 76 %
Platelets: 247 10*3/uL (ref 150–400)
RBC: 4.26 MIL/uL (ref 3.87–5.11)
RDW: 13.3 % (ref 11.5–15.5)
WBC: 13.5 10*3/uL — ABNORMAL HIGH (ref 4.0–10.5)
nRBC: 0 % (ref 0.0–0.2)

## 2021-12-21 LAB — BASIC METABOLIC PANEL
Anion gap: 9 (ref 5–15)
BUN: 12 mg/dL (ref 6–20)
CO2: 21 mmol/L — ABNORMAL LOW (ref 22–32)
Calcium: 8.6 mg/dL — ABNORMAL LOW (ref 8.9–10.3)
Chloride: 103 mmol/L (ref 98–111)
Creatinine, Ser: 0.51 mg/dL (ref 0.44–1.00)
GFR, Estimated: >60 mL/min (ref >60)
Glucose, Bld: 118 mg/dL — ABNORMAL HIGH (ref 70–99)
Potassium: 5.3 mmol/L — ABNORMAL HIGH (ref 3.5–5.1)
Sodium: 133 mmol/L — ABNORMAL LOW (ref 135–145)

## 2021-12-21 LAB — HEMOGLOBIN A1C
Hgb A1c MFr Bld: 4.6 % — ABNORMAL LOW (ref 4.8–5.6)
Mean Plasma Glucose: 85.32 mg/dL

## 2021-12-21 LAB — HCG, QUANTITATIVE, PREGNANCY: hCG, Beta Chain, Quant, S: 1 m[IU]/mL (ref <5)

## 2021-12-21 LAB — POTASSIUM: Potassium: 3.6 mmol/L (ref 3.5–5.1)

## 2021-12-21 LAB — LACTIC ACID, PLASMA: Lactic Acid, Venous: 0.8 mmol/L (ref 0.5–1.9)

## 2021-12-21 MED ORDER — IOHEXOL 300 MG/ML  SOLN
75.0000 mL | Freq: Once | INTRAMUSCULAR | Status: AC | PRN
  Administered 2021-12-21: 75 mL via INTRAVENOUS

## 2021-12-21 MED ORDER — VANCOMYCIN HCL IN DEXTROSE 1-5 GM/200ML-% IV SOLN
1000.0000 mg | Freq: Once | INTRAVENOUS | Status: AC
Start: 2021-12-21 — End: 2021-12-21
  Administered 2021-12-21: 1000 mg via INTRAVENOUS
  Filled 2021-12-21: qty 200

## 2021-12-21 MED ORDER — HYDROMORPHONE HCL 1 MG/ML IJ SOLN
1.0000 mg | Freq: Once | INTRAMUSCULAR | Status: AC
  Administered 2021-12-21: 1 mg via INTRAVENOUS
  Filled 2021-12-21: qty 1

## 2021-12-21 MED ORDER — POLYETHYLENE GLYCOL 3350 17 G PO PACK: 17.0000 g | PACK | Freq: Every day | ORAL | Status: DC | PRN

## 2021-12-21 MED ORDER — IBUPROFEN 600 MG PO TABS
600.0000 mg | ORAL_TABLET | Freq: Three times a day (TID) | ORAL | Status: DC | PRN
  Administered 2021-12-23: 600 mg via ORAL
  Filled 2021-12-21: qty 1

## 2021-12-21 MED ORDER — MORPHINE SULFATE (PF) 4 MG/ML IV SOLN
4.0000 mg | Freq: Once | INTRAVENOUS | Status: AC
  Administered 2021-12-21: 4 mg via INTRAVENOUS
  Filled 2021-12-21: qty 1

## 2021-12-21 MED ORDER — VANCOMYCIN HCL 750 MG/150ML IV SOLN
750.0000 mg | Freq: Two times a day (BID) | INTRAVENOUS | Status: DC
Start: 2021-12-22 — End: 2021-12-24
  Administered 2021-12-22 – 2021-12-24 (×5): 750 mg via INTRAVENOUS
  Filled 2021-12-21 (×5): qty 150

## 2021-12-21 MED ORDER — SODIUM CHLORIDE 0.9 % IV BOLUS
1000.0000 mL | Freq: Once | INTRAVENOUS | Status: AC
  Administered 2021-12-21: 1000 mL via INTRAVENOUS

## 2021-12-21 MED ORDER — ONDANSETRON HCL 4 MG/2ML IJ SOLN
4.0000 mg | Freq: Once | INTRAMUSCULAR | Status: AC
  Administered 2021-12-21: 4 mg via INTRAVENOUS
  Filled 2021-12-21: qty 2

## 2021-12-21 MED ORDER — ACETAMINOPHEN 325 MG PO TABS
650.0000 mg | ORAL_TABLET | Freq: Four times a day (QID) | ORAL | Status: DC | PRN
  Administered 2021-12-21 – 2021-12-24 (×3): 650 mg via ORAL
  Filled 2021-12-21 (×3): qty 2

## 2021-12-21 MED ORDER — KETOROLAC TROMETHAMINE 30 MG/ML IJ SOLN
30.0000 mg | Freq: Once | INTRAMUSCULAR | Status: AC
  Administered 2021-12-21: 30 mg via INTRAVENOUS
  Filled 2021-12-21: qty 1

## 2021-12-21 MED ORDER — ONDANSETRON HCL 4 MG PO TABS: 4.0000 mg | ORAL_TABLET | Freq: Four times a day (QID) | ORAL | Status: DC | PRN

## 2021-12-21 MED ORDER — ONDANSETRON HCL 4 MG/2ML IJ SOLN
4.0000 mg | Freq: Four times a day (QID) | INTRAMUSCULAR | Status: DC | PRN
  Filled 2021-12-21: qty 2

## 2021-12-21 MED ORDER — ACETAMINOPHEN 650 MG RE SUPP: 650.0000 mg | Freq: Four times a day (QID) | RECTAL | Status: DC | PRN

## 2021-12-21 MED ORDER — GABAPENTIN 300 MG PO CAPS
300.0000 mg | ORAL_CAPSULE | Freq: Every day | ORAL | Status: DC | PRN
  Administered 2021-12-21 – 2021-12-24 (×3): 300 mg via ORAL
  Filled 2021-12-21 (×3): qty 1

## 2021-12-21 MED ORDER — SODIUM CHLORIDE 0.9 % IV SOLN: INTRAVENOUS | Status: DC

## 2021-12-21 MED ORDER — CHLORHEXIDINE GLUCONATE 4 % EX LIQD
Freq: Once | CUTANEOUS | Status: AC
  Filled 2021-12-21: qty 15

## 2021-12-21 NOTE — ED Notes (Signed)
Receiving nurse to call back for report.  

## 2021-12-21 NOTE — ED Notes (Signed)
PA to bedside to collect wound culture. Unable to collect sufficient amount of drainage at this time and states that she will continue to work on collection. Culture tube remains in room.

## 2021-12-21 NOTE — Assessment & Plan Note (Addendum)
Rules in for sepsis with tachycardia heart rate ranging from 87-112, leukocytosis of 13.5.  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 - EDP talked to Dr. Dallas Schimke- start warm saline and chlorhexidine soaks,  collect a culture of any drainage from the site, start vancomycin, n.p.o. after midnight.  He will plan to see the patient in the morning to determine whether he will take her to the OR - UDS (Dilaudid and morphine already given in ED) -IV ketorolac 30 mg x 1, continue ibuprofen 600 8 hourly PRN - 1L bolus, continue LR 100cc/hr x 20hrs -Obtain CT right forearm-ill-defined 4.6 cm fluid collection, no well-defined mass margin.  Findings in keeping with abscess/phlegmon. -Obtain blood cultures -Check lactic acid- 0.8 - HgbA1C, HIV

## 2021-12-21 NOTE — ED Notes (Signed)
Patient to CT at this time

## 2021-12-21 NOTE — ED Provider Notes (Signed)
[facility] EMERGENCY DEPARTMENT Provider Note   CSN: [id number] Arrival date & time: 12/21/21  1245     History  Chief Complaint  Patient presents with   Abscess    Dorothy Strong is a 36 y.o. female with no significant past medical history presenting with a large abscess at her right dorsal forearm.  She states she developed a small pimple at the site about 6 days ago and over the past 4 days it has become significantly more painful and swollen.  She has radiation of pain into her right dorsal hand with significant swelling of the hand and fingers.  She also reports having a fever last night to 100.8.  She has applied that back to the wound and states that it started to drain a small amount of green purulent discharge last night.  She has taken anti-inflammatories with no significant relief of symptoms.  She denies prior history of problems with abscesses, denies known MRSA, also denies substance abuse/IV drug use.  The history is provided by the patient and the spouse.       Home Medications Prior to Admission medications   Medication Sig Start Date End Date Taking? Authorizing Provider  gabapentin (NEURONTIN) 300 MG capsule Take 1 capsule by mouth daily as needed (nerve pain). 04/15/18  Yes [provider]  meloxicam (MOBIC) 7.5 MG tablet Take 1 tablet (7.5 mg total) by mouth daily. 11/08/21  Yes IdolRaynelle Fanning, PA-C      Allergies    Patient has no known allergies.    Review of Systems   Review of Systems  Constitutional:  Negative for fever.  Musculoskeletal:  Positive for arthralgias. Negative for myalgias.  Skin:  Positive for color change and wound.  Neurological:  Negative for weakness and numbness.    Physical Exam Updated Vital Signs BP 99/72   Pulse (!) 109   Temp 98.1 F (36.7 C) (Oral)   Resp 17   Ht 5\' 5"  (1.651 m)   Wt 56.7 kg   LMP 11/14/2021 (Approximate) Comment: lasted 3 days  SpO2 100%   BMI 20.80 kg/m  Physical Exam Constitutional:       General: She is not in acute distress.    Appearance: She is well-developed.  HENT:     Head: Normocephalic.  Cardiovascular:     Rate and Rhythm: Tachycardia present.  Pulmonary:     Effort: Pulmonary effort is normal.     Breath sounds: No wheezing.  Musculoskeletal:        General: Normal range of motion.     Cervical back: Neck supple.  Skin:    Findings: Erythema present.     Comments: Large raised abscess with 2 small areas of purulent discharge.  She has no tenderness proximal or distal to the abscess site itself but has significant erythema and edema per images below.  Radial pulses intact, less than 2-second cap refill in fingertips.        ED Results / Procedures / Treatments   Labs (all labs ordered are listed, but only abnormal results are displayed) Labs Reviewed  CBC WITH DIFFERENTIAL/PLATELET - Abnormal; Notable for the following components:      Result Value   WBC 13.5 (*)    Neutro Abs 10.2 (*)    Monocytes Absolute 1.3 (*)    Abs Immature Granulocytes 0.08 (*)    All other components within normal limits  BASIC METABOLIC PANEL - Abnormal; Notable for the following components:   Sodium 133 (*)  Potassium 5.3 (*)    CO2 21 (*)    Glucose, Bld 118 (*)    Calcium 8.6 (*)    All other components within normal limits  AEROBIC CULTURE W GRAM STAIN (SUPERFICIAL SPECIMEN)  CULTURE, BLOOD (ROUTINE X 2)  CULTURE, BLOOD (ROUTINE X 2)  HCG, QUANTITATIVE, PREGNANCY  POTASSIUM  LACTIC ACID, PLASMA  RAPID URINE DRUG SCREEN, HOSP PERFORMED    EKG None  Radiology DG Forearm Right  Result Date: 12/21/2021 CLINICAL DATA:  Dorsal wrist abscess. EXAM: RIGHT FOREARM - 2 VIEW COMPARISON:  None Available. FINDINGS: The wrist and elbow joints are maintained. No acute bony findings are identified. There is a large subcutaneous soft tissue protuberance noted along the dorsal aspect of the distal forearm/wrist. IMPRESSION: No bony abnormalities. Electronically Signed    By: Rudie Meyer M.D.   On: 12/21/2021 14:03    Procedures Procedures    Medications Ordered in ED Medications  vancomycin (VANCOCIN) IVPB 1000 mg/200 mL premix (1,000 mg Intravenous New Bag/Given 12/21/21 1632)  morphine (PF) 4 MG/ML injection 4 mg (4 mg Intravenous Given 12/21/21 1342)  ondansetron (ZOFRAN) injection 4 mg (4 mg Intravenous Given 12/21/21 1341)  HYDROmorphone (DILAUDID) injection 1 mg (1 mg Intravenous Given 12/21/21 1454)    ED Course/ Medical Decision Making/ A&P                           Medical Decision Making Patient with a significant right forearm abscess with cellulitis into her hand, unclear etiology, denies injury, IV drug use.  Reports fever of 100.8 yesterday evening.  Discussed with Dr. Karlton Lemon of Ortho regarding whether he felt patient would benefit from surgical debridement.  He has asked that patient be admitted for warm saline and chlorhexidine soaks, he has asked for [place] to collect a culture of any drainage from the site, start vancomycin, n.p.o. after midnight.  He will plan to see the patient in the morning to determine whether he will take her to the OR.  Amount and/or Complexity of Data Reviewed Labs: ordered.    Details: Reviewed, significant for leukocytosis with a WBC count of 13.5.  She also has a borderline elevated potassium at 5.3, however there is suggestion of mild hemolysis of the specimen.  We will repeat a potassium level. Radiology: ordered.  Risk Prescription drug management.           Final Clinical Impression(s) / ED Diagnoses Final diagnoses:  Abscess    Rx / DC Orders ED Discharge Orders     None         Victoriano Lain 12/21/21 1642    [place], [place], MD 12/23/21 0740

## 2021-12-21 NOTE — ED Triage Notes (Signed)
Pt with large abscess to right wrist.  Started off as a small pimple per pt, pt states she put some fatback on it.  Pt noted it 4 days ago. Fever last night 100.8. started to drain last night per pt with foul odor.

## 2021-12-21 NOTE — Assessment & Plan Note (Signed)
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

## 2021-12-21 NOTE — H&P (Signed)
History and Physical    Dorothy Saxmily A Lawler ZOX:[id number] DOB: 05-26-1986 DOA: 12/21/2021  PCP: Pcp, No   Patient coming from: Home  I have personally briefly [facility]  Chief Complaint: Right hand pain and swelling  HPI: Dorothy Strong is a 36 y.o. female with medical history significant for substance use.  Patient presented to the ED with complaints of swelling and pain to her right forearm of 6 days duration, significantly worsened over the last 4 days.  She reports fever of up to 100.8 at home.  She reports it started as a pimple.  Denies any trauma to the area.  Today it started to drain, it was greenish, purulence and had a foul odor.  She has an old prescription of Augmentin, took 2 doses yesterday and 1 today.  Patient reports she used to snort heroin, but has done that or use any drugs in 4 months.  She reports maybe a couple of times she used IV drugs a long time ago.  ED Course: Temperature 98.1.  Heart rate 87-112.  Respirate rate 17.  Blood pressure systolic 99-128.  O2 sat greater than 98% on room air. WBC 13.5.  X-ray of right forearm without bony abnormalities. EDP talked to Dr. Dallas Schimkeairns, start vancomycin, n.p.o. midnight, will see in a.m.  Review of Systems: As per HPI all other systems reviewed and negative.  Past Medical History:  Diagnosis Date   Abnormal Pap smear of cervix    Condyloma acuminatum    Depression     Past Surgical History:  Procedure Laterality Date   ADENOIDECTOMY     CERVICAL CONIZATION W/BX N/A 02/17/2014   Procedure: CONIZATION CERVIX WITH BIOPSY;  Surgeon: Tilda BurrowJohn Ferguson V, MD;  Location: [facility];  Service: Gynecology;  Laterality: N/A;   COLPOSCOPY     TONSILLECTOMY       reports that she has been smoking cigarettes. She has a 8.00 pack-year smoking history. She has never used smokeless tobacco. She reports that she does not drink alcohol and does not use drugs.  No Known Allergies  Family  History  Problem Relation Age of Onset   Hypertension Father    Cancer Other        prostate    Diabetes Other    Arthritis Other    Hypertension Other     Prior to Admission medications   Medication Sig Start Date End Date Taking? Authorizing Provider  gabapentin (NEURONTIN) 300 MG capsule Take 1 capsule by mouth daily as needed (nerve pain). 04/15/18  Yes [provider]  meloxicam (MOBIC) 7.5 MG tablet Take 1 tablet (7.5 mg total) by mouth daily. 11/08/21  Yes Burgess Amordol, Julie, PA-C    Physical Exam: Vitals:   12/21/21 1430 12/21/21 1605 12/21/21 1605 12/21/21 1635  BP: (!) 118/91 (!) 128/101 (!) 125/99 99/72  Pulse: 90 93 87 (!) 109  Resp: 17  17 17   Temp:      TempSrc:      SpO2: 100% 100% 98% 100%  Weight:      Height:        Constitutional: NAD, calm, comfortable Vitals:   12/21/21 1430 12/21/21 1605 12/21/21 1605 12/21/21 1635  BP: (!) 118/91 (!) 128/101 (!) 125/99 99/72  Pulse: 90 93 87 (!) 109  Resp: 17  17 17   Temp:      TempSrc:      SpO2: 100% 100% 98% 100%  Weight:  Height:       Eyes: PERRL, lids and conjunctivae normal ENMT: Mucous membranes are moist. Posterior pharynx clear of any exudate or lesions.Normal dentition.  Neck: normal, supple, no masses, no thyromegaly Respiratory: clear to auscultation bilaterally, no wheezing, no crackles. Normal respiratory effort. No accessory muscle use.  Cardiovascular: Regular rate and rhythm, no murmurs / rubs / gallops. No extremity edema. 2+ pedal pulses. No carotid bruits.  Abdomen: no tenderness, no masses palpated. No hepatosplenomegaly. Bowel sounds positive.  Musculoskeletal: no clubbing / cyanosis. No joint deformity upper and lower extremities. Good ROM, no contractures. Normal muscle tone.  Skin: Markedly tender, significant swelling, and marked tenderness to right forearm area swelling extending past the wrist towards fingers, with a large scabbed area.  No obvious drainage on my  evaluation Neurologic: CN 2-12 grossly intact. Sensation intact, DTR normal. Strength 5/5 in all 4.  Psychiatric: Normal judgment and insight. Alert and oriented x 3. Normal mood.        Labs on Admission: I have personally reviewed following labs and imaging studies  CBC: Recent Labs  Lab 12/21/21 1337  WBC 13.5*  NEUTROABS 10.2*  HGB 13.1  HCT 38.5  MCV 90.4  PLT 247   Basic Metabolic Panel: Recent Labs  Lab 12/21/21 1337  NA 133*  K 5.3*  CL 103  CO2 21*  GLUCOSE 118*  BUN 12  CREATININE 0.51  CALCIUM 8.6*    Radiological Exams on Admission: DG Forearm Right  Result Date: 12/21/2021 CLINICAL DATA:  Dorsal wrist abscess. EXAM: RIGHT FOREARM - 2 VIEW COMPARISON:  None Available. FINDINGS: The wrist and elbow joints are maintained. No acute bony findings are identified. There is a large subcutaneous soft tissue protuberance noted along the dorsal aspect of the distal forearm/wrist. IMPRESSION: No bony abnormalities. Electronically Signed   By: Rudie Meyer M.D.   On: 12/21/2021 14:03    EKG:  none   Assessment/Plan Principal Problem:   Abscess of right upper extremity Active Problems:   Substance abuse (HCC)   Sepsis (HCC)   Assessment and Plan: * Abscess of right upper extremity Rules in for sepsis with tachycardia heart rate ranging from 87-112, leukocytosis of 13.5.  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 - EDP talked to Dr. Dallas Schimke- start warm saline and chlorhexidine soaks,  collect a culture of any drainage from the site, start vancomycin, n.p.o. after midnight.  He will plan to see the patient in the morning to determine whether he will take her to the OR - UDS (Dilaudid and morphine already given in ED) -IV ketorolac 30 mg x 1, continue ibuprofen 600 8 hourly PRN - 1L bolus, continue LR 100cc/hr x 20hrs -Obtain CT right forearm-ill-defined 4.6 cm fluid  collection, no well-defined mass margin.  Findings in keeping with abscess/phlegmon. 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   DVT prophylaxis: scds pending Ortho eval Code Status: full Family Communication: none at bedside  Disposition Plan: > 2 days Consults called: Ortho Admission status:  Inpt tele I certify that at the point of admission it is my clinical judgment that the patient will require inpatient hospital care spanning beyond 2 midnights from the point of admission due  to high intensity of service, high risk for further deterioration and high frequency of surveillance required.   Author: Onnie Boer, MD 12/21/2021 4:43 PM  For on call review [email or web address].

## 2021-12-21 NOTE — Progress Notes (Signed)
Pharmacy Antibiotic Note  Dorothy Strong is a 36 y.o. female admitted on 12/21/2021 with sepsis 2/2 right forearm abscess.  Pharmacy has been consulted for vancomycin dosing.  Plan: Vancomycin 750 IV every 12 hours.  Estimated AUC: 545 mcg*hr/mL Creatinine used: 0.8 mg/dL (rounded up from 9.74 mg/dL) Estimated Ke: 1.638 hr-1 Half-life: 9.5 hours  Height: 5\' 5"  (165.1 cm) Weight: 52.6 kg (115 lb 14.4 oz) IBW/kg (Calculated) : 57  Temp (24hrs), Avg:98.1 F (36.7 C), Min:98.1 F (36.7 C), Max:98.1 F (36.7 C)  Recent Labs  Lab 12/21/21 1337 12/21/21 1749  WBC 13.5*  --   CREATININE 0.51  --   LATICACIDVEN  --  0.8    Estimated Creatinine Clearance: 82.3 mL/min (by C-G formula based on SCr of 0.51 mg/dL).    No Known Allergies  Antimicrobials this admission: vanco 6/14 >>    Microbiology results: 6/14 BCx: in process 6/14 MRSA PCR: needs to be collected at time of this note  Thank you for allowing pharmacy to be a part of this patient's care.  7/14 BS, PharmD, BCPS Clinical Pharmacist 12/21/2021 8:40 PM  Contact: [phone] after 3 PM  "Be curious, not judgmental..." -[phone]

## 2021-12-22 ENCOUNTER — Encounter (HOSPITAL_COMMUNITY)
Admission: EM | Disposition: A | Discharge status: Home / Self Care | Payer: Self-pay | Source: Home / Self Care | Attending: Family Medicine

## 2021-12-22 ENCOUNTER — Inpatient Hospital Stay (HOSPITAL_COMMUNITY): Payer: Medicaid Other | Admitting: Anesthesiology

## 2021-12-22 ENCOUNTER — Encounter (HOSPITAL_COMMUNITY): Payer: Self-pay | Admitting: Internal Medicine

## 2021-12-22 DIAGNOSIS — L02413 Cutaneous abscess of right upper limb: Secondary | ICD-10-CM | POA: Diagnosis not present

## 2021-12-22 HISTORY — PX: I & D EXTREMITY: SHX5045

## 2021-12-22 LAB — RAPID URINE DRUG SCREEN, HOSP PERFORMED
Amphetamines: POSITIVE — AB
Barbiturates: NOT DETECTED
Benzodiazepines: POSITIVE — AB
Cocaine: NOT DETECTED
Opiates: POSITIVE — AB
Tetrahydrocannabinol: POSITIVE — AB

## 2021-12-22 LAB — CBC
HCT: 35.6 % — ABNORMAL LOW (ref 36.0–46.0)
Hemoglobin: 11.7 g/dL — ABNORMAL LOW (ref 12.0–15.0)
MCH: 30.6 pg (ref 26.0–34.0)
MCHC: 32.9 g/dL (ref 30.0–36.0)
MCV: 93.2 fL (ref 80.0–100.0)
Platelets: 248 10*3/uL (ref 150–400)
RBC: 3.82 MIL/uL — ABNORMAL LOW (ref 3.87–5.11)
RDW: 13.3 % (ref 11.5–15.5)
WBC: 8.9 10*3/uL (ref 4.0–10.5)
nRBC: 0 % (ref 0.0–0.2)

## 2021-12-22 LAB — URINALYSIS, ROUTINE W REFLEX MICROSCOPIC
Bilirubin Urine: NEGATIVE
Glucose, UA: NEGATIVE mg/dL
Hgb urine dipstick: NEGATIVE
Ketones, ur: NEGATIVE mg/dL
Leukocytes,Ua: NEGATIVE
Nitrite: NEGATIVE
Protein, ur: NEGATIVE mg/dL
Specific Gravity, Urine: 1.025 (ref 1.005–1.030)
pH: 8 (ref 5.0–8.0)

## 2021-12-22 LAB — BASIC METABOLIC PANEL
Anion gap: 5 (ref 5–15)
BUN: 9 mg/dL (ref 6–20)
CO2: 22 mmol/L (ref 22–32)
Calcium: 8.3 mg/dL — ABNORMAL LOW (ref 8.9–10.3)
Chloride: 111 mmol/L (ref 98–111)
Creatinine, Ser: 0.41 mg/dL — ABNORMAL LOW (ref 0.44–1.00)
GFR, Estimated: >60 mL/min (ref >60)
Glucose, Bld: 92 mg/dL (ref 70–99)
Potassium: 3.9 mmol/L (ref 3.5–5.1)
Sodium: 138 mmol/L (ref 135–145)

## 2021-12-22 LAB — GLUCOSE, CAPILLARY
Glucose-Capillary: 103 mg/dL — ABNORMAL HIGH (ref 70–99)
Glucose-Capillary: 176 mg/dL — ABNORMAL HIGH (ref 70–99)

## 2021-12-22 LAB — SEDIMENTATION RATE: Sed Rate: 25 mm/hr — ABNORMAL HIGH (ref 0–22)

## 2021-12-22 LAB — C-REACTIVE PROTEIN: CRP: 9.5 mg/dL — ABNORMAL HIGH (ref <1.0)

## 2021-12-22 LAB — MRSA NEXT GEN BY PCR, NASAL: MRSA by PCR Next Gen: NOT DETECTED

## 2021-12-22 LAB — HIV ANTIBODY (ROUTINE TESTING W REFLEX): HIV Screen 4th Generation wRfx: NONREACTIVE

## 2021-12-22 SURGERY — IRRIGATION AND DEBRIDEMENT EXTREMITY
Anesthesia: General | Site: Arm Lower | Laterality: Right

## 2021-12-22 MED ORDER — DEXAMETHASONE SODIUM PHOSPHATE 10 MG/ML IJ SOLN
INTRAMUSCULAR | Status: DC | PRN
  Administered 2021-12-22: 5 mg via INTRAVENOUS

## 2021-12-22 MED ORDER — KETOROLAC TROMETHAMINE 15 MG/ML IJ SOLN
15.0000 mg | Freq: Four times a day (QID) | INTRAMUSCULAR | Status: DC | PRN
Start: 2021-12-22 — End: 2021-12-22
  Administered 2021-12-22: 15 mg via INTRAVENOUS
  Filled 2021-12-22: qty 1

## 2021-12-22 MED ORDER — KETAMINE HCL 10 MG/ML IJ SOLN
INTRAMUSCULAR | Status: DC | PRN
  Administered 2021-12-22: 20 mg via INTRAVENOUS

## 2021-12-22 MED ORDER — PROPOFOL 10 MG/ML IV BOLUS
INTRAVENOUS | Status: DC | PRN
  Administered 2021-12-22: 200 mg via INTRAVENOUS

## 2021-12-22 MED ORDER — FENTANYL CITRATE (PF) 100 MCG/2ML IJ SOLN
INTRAMUSCULAR | Status: DC | PRN
  Administered 2021-12-22 (×2): 50 ug via INTRAVENOUS

## 2021-12-22 MED ORDER — DEXAMETHASONE SODIUM PHOSPHATE 10 MG/ML IJ SOLN
INTRAMUSCULAR | Status: AC
  Filled 2021-12-22: qty 1

## 2021-12-22 MED ORDER — CHLORHEXIDINE GLUCONATE 4 % EX LIQD
Freq: Two times a day (BID) | CUTANEOUS | Status: DC
  Filled 2021-12-22: qty 15

## 2021-12-22 MED ORDER — MIDAZOLAM HCL 5 MG/5ML IJ SOLN
INTRAMUSCULAR | Status: DC | PRN
  Administered 2021-12-22: 2 mg via INTRAVENOUS

## 2021-12-22 MED ORDER — ONDANSETRON HCL 4 MG/2ML IJ SOLN
INTRAMUSCULAR | Status: DC | PRN
  Administered 2021-12-22: 4 mg via INTRAVENOUS

## 2021-12-22 MED ORDER — KETAMINE HCL 50 MG/5ML IJ SOSY
PREFILLED_SYRINGE | INTRAMUSCULAR | Status: AC
  Filled 2021-12-22: qty 5

## 2021-12-22 MED ORDER — ONDANSETRON HCL 4 MG/2ML IJ SOLN
INTRAMUSCULAR | Status: AC
  Filled 2021-12-22: qty 2

## 2021-12-22 MED ORDER — HYDROMORPHONE HCL 1 MG/ML IJ SOLN
0.2500 mg | INTRAMUSCULAR | Status: DC | PRN
  Administered 2021-12-22 (×2): 0.5 mg via INTRAVENOUS
  Filled 2021-12-22 (×2): qty 0.5

## 2021-12-22 MED ORDER — LIDOCAINE 2% (20 MG/ML) 5 ML SYRINGE
INTRAMUSCULAR | Status: DC | PRN
  Administered 2021-12-22: 100 mg via INTRAVENOUS

## 2021-12-22 MED ORDER — KETOROLAC TROMETHAMINE 15 MG/ML IJ SOLN
30.0000 mg | Freq: Four times a day (QID) | INTRAMUSCULAR | Status: DC | PRN
Start: 2021-12-22 — End: 2021-12-24
  Administered 2021-12-22 – 2021-12-24 (×6): 30 mg via INTRAVENOUS
  Filled 2021-12-22 (×7): qty 2

## 2021-12-22 MED ORDER — FENTANYL CITRATE (PF) 100 MCG/2ML IJ SOLN
INTRAMUSCULAR | Status: AC
  Filled 2021-12-22: qty 2

## 2021-12-22 MED ORDER — MIDAZOLAM HCL 2 MG/2ML IJ SOLN
INTRAMUSCULAR | Status: AC
  Filled 2021-12-22: qty 2

## 2021-12-22 MED ORDER — SODIUM CHLORIDE 0.9 % IR SOLN
Status: DC | PRN
  Administered 2021-12-22: 3000 mL

## 2021-12-22 MED ORDER — GLYCOPYRROLATE PF 0.2 MG/ML IJ SOSY
PREFILLED_SYRINGE | INTRAMUSCULAR | Status: DC | PRN
  Administered 2021-12-22: .1 mg via INTRAVENOUS

## 2021-12-22 MED ORDER — LIDOCAINE HCL (PF) 2 % IJ SOLN
INTRAMUSCULAR | Status: AC
  Filled 2021-12-22: qty 5

## 2021-12-22 MED ORDER — ONDANSETRON HCL 4 MG/2ML IJ SOLN: 4.0000 mg | Freq: Once | INTRAMUSCULAR | Status: DC | PRN

## 2021-12-22 MED ORDER — CHLORHEXIDINE GLUCONATE 4 % EX LIQD
CUTANEOUS | Status: AC
  Filled 2021-12-22: qty 15

## 2021-12-22 SURGICAL SUPPLY — 39 items
BANDAGE ELASTIC 6 VELCRO NS (GAUZE/BANDAGES/DRESSINGS) ×1 IMPLANT
BANDAGE ESMARK 4X12 BL STRL LF (DISPOSABLE) ×1 IMPLANT
BNDG COHESIVE 4X5 TAN STRL (GAUZE/BANDAGES/DRESSINGS) IMPLANT
BNDG ELASTIC 4X5.8 VLCR NS LF (GAUZE/BANDAGES/DRESSINGS) IMPLANT
BNDG ESMARK 4X12 BLUE STRL LF (DISPOSABLE) ×2
BNDG GAUZE ELAST 4 BULKY (GAUZE/BANDAGES/DRESSINGS) ×1 IMPLANT
CANNULA VESSEL 3MM 2 BLNT TIP (CANNULA) IMPLANT
CLOTH BEACON ORANGE TIMEOUT ST (SAFETY) ×2 IMPLANT
COVER LIGHT HANDLE STERIS (MISCELLANEOUS) ×4 IMPLANT
CUFF TOURN SGL QUICK 18X4 (TOURNIQUET CUFF) ×2 IMPLANT
DECANTER SPIKE VIAL GLASS SM (MISCELLANEOUS) ×2 IMPLANT
DRAIN PENROSE 12X.25 LTX STRL (MISCELLANEOUS) ×1 IMPLANT
ELECT REM PT RETURN 9FT ADLT (ELECTROSURGICAL) ×2
ELECTRODE REM PT RTRN 9FT ADLT (ELECTROSURGICAL) ×1 IMPLANT
GAUZE SPONGE 4X4 12PLY STRL (GAUZE/BANDAGES/DRESSINGS) ×2 IMPLANT
GAUZE XEROFORM 1X8 LF (GAUZE/BANDAGES/DRESSINGS) ×2 IMPLANT
GLOVE BIOGEL PI IND STRL 7.0 (GLOVE) ×2 IMPLANT
GLOVE BIOGEL PI INDICATOR 7.0 (GLOVE) ×2
GLOVE SRG 8 PF TXTR STRL LF DI (GLOVE) ×1 IMPLANT
GLOVE SURG POLYISO LF SZ8 (GLOVE) ×6 IMPLANT
GLOVE SURG UNDER POLY LF SZ8 (GLOVE) ×2
GOWN STRL REUS W/ TWL XL LVL3 (GOWN DISPOSABLE) ×1 IMPLANT
GOWN STRL REUS W/TWL LRG LVL3 (GOWN DISPOSABLE) ×2 IMPLANT
GOWN STRL REUS W/TWL XL LVL3 (GOWN DISPOSABLE) ×2
HANDPIECE INTERPULSE COAX TIP (DISPOSABLE) ×2
INST SET MINOR BONE (KITS) ×2 IMPLANT
IV NS IRRIG 3000ML ARTHROMATIC (IV SOLUTION) ×1 IMPLANT
KIT TURNOVER KIT A (KITS) ×2 IMPLANT
MANIFOLD NEPTUNE II (INSTRUMENTS) ×2 IMPLANT
NS IRRIG 1000ML POUR BTL (IV SOLUTION) ×2 IMPLANT
PACK BASIC LIMB (CUSTOM PROCEDURE TRAY) ×2 IMPLANT
PAD ARMBOARD 7.5X6 YLW CONV (MISCELLANEOUS) ×2 IMPLANT
SET BASIN LINEN APH (SET/KITS/TRAYS/PACK) ×2 IMPLANT
SET HNDPC FAN SPRY TIP SCT (DISPOSABLE) ×1 IMPLANT
SOL PREP PROV IODINE SCRUB 4OZ (MISCELLANEOUS) ×2 IMPLANT
SUT ETHILON 3 0 FSL (SUTURE) IMPLANT
SWAB CULTURE ESWAB REG 1ML (MISCELLANEOUS) ×1 IMPLANT
SWAB CULTURE LIQ STUART DBL (MISCELLANEOUS) IMPLANT
SYR BULB IRRIG 60ML STRL (SYRINGE) ×2 IMPLANT

## 2021-12-22 NOTE — Progress Notes (Signed)
PROGRESS NOTE     Dorothy Strong, is a 36 y.o. female, DOB - 1985-12-18, YQM:[id number]  Admit date - 12/21/2021   Admitting Physician Ejiroghene Arlyce Dice, MD  Outpatient Primary MD for the patient is Pcp, No  LOS - 1  Chief Complaint  Patient presents with   Abscess        -Assessment and Plan: 1) sepsis secondary to abscess of right forearm --- s/p Incision and drainage of right forearm abscess on 12/22/21 by Dr Amedeo Kinsman -Penrose drain in situ -ESR was 25, CRP is 9.5 -WBC down to 8.9 from 13.5 -Continue IV vancomycin and IV fluids pending or wound cultures  2)Polysubstance abuse ([place]) -Suspect/probably related to IV drug use -UDS positive for benzos opiates amphetamines and THC -TOC referral for rehab info  Disposition/Need for in-Hospital Stay- patient unable to be discharged at this time due to -sepsis secondary to right forearm abscess status post I&D, requiring IV antibiotics pending further culture data  Status is: Inpatient   Disposition: The patient is from: Home              Anticipated d/c is to: Home              Anticipated d/c date is: 2 days              Patient currently is not medically stable to d/c. Barriers: Not Clinically Stable-   Code Status :  -  Code Status: Full Code   Family Communication:    NA (patient is alert, awake and coherent)   DVT Prophylaxis  :   - SCDs  SCDs Start: 12/21/21 2015   Lab Results  Component Value Date   PLT 248 12/22/2021    Inpatient Medications  Scheduled Meds:  chlorhexidine   Topical BID   Continuous Infusions:  vancomycin Stopped (12/22/21 0645)   PRN Meds:.acetaminophen **OR** acetaminophen, gabapentin, ibuprofen, ketorolac, ondansetron **OR** ondansetron (ZOFRAN) IV, polyethylene glycol   Anti-infectives (From admission, onward)    Start     Dose/Rate Route Frequency Ordered Stop   12/22/21 0600  vancomycin (VANCOREADY) IVPB 750 mg/150 mL        750 mg 150 mL/hr over 60 Minutes Intravenous Every 12  hours 12/21/21 2030     12/21/21 1615  vancomycin (VANCOCIN) IVPB 1000 mg/200 mL premix        1,000 mg 200 mL/hr over 60 Minutes Intravenous  Once 12/21/21 1606 12/21/21 1748         Subjective: Dorothy Strong today has no fevers, no emesis,  No chest pain,   - Complains of right forearm pain   Objective: Vitals:   12/22/21 1415 12/22/21 1417 12/22/21 1427 12/22/21 1430  BP: (!) 129/93   118/89  Pulse: 91 99 83 74  Resp: 15 17 18 11   Temp:      TempSrc:      SpO2: 100% 100% 100% 100%  Weight:      Height:        Intake/Output Summary (Last 24 hours) at 12/22/2021 1802 Last data filed at 12/22/2021 1659 Gross per 24 hour  Intake 1430.1 ml  Output 220 ml  Net 1210.1 ml   Filed Weights   12/21/21 1254 12/21/21 1800  Weight: 56.7 kg 52.6 kg    Physical Exam  Gen:- Awake Alert, cooperative, in no acute distress HEENT:- [place].AT, No sclera icterus, multiple missing teeth poor dental hygiene and caries Neck-Supple Neck,No JVD,.  Lungs-  CTAB , fair symmetrical air movement CV-  S1, S2 normal, regular  Abd-  +ve B.Sounds, Abd Soft, No tenderness,    Psych-affect is appropriate, oriented x3 Neuro-no new focal deficits, no tremors MSK-right forearm with dressing on postop, swelling erythema tenderness and warmth noted  Data Reviewed: I have personally reviewed following labs and imaging studies  CBC: Recent Labs  Lab 12/21/21 1337 12/22/21 0501  WBC 13.5* 8.9  NEUTROABS 10.2*  --   HGB 13.1 11.7*  HCT 38.5 35.6*  MCV 90.4 93.2  PLT 247 209   Basic Metabolic Panel: Recent Labs  Lab 12/21/21 1337 12/21/21 1749 12/22/21 0501  NA 133*  --  138  K 5.3* 3.6 3.9  CL 103  --  111  CO2 21*  --  22  GLUCOSE 118*  --  92  BUN 12  --  9  CREATININE 0.51  --  0.41*  CALCIUM 8.6*  --  8.3*   GFR: Estimated Creatinine Clearance: 82.3 mL/min (A) (by C-G formula based on SCr of 0.41 mg/dL (L)). Liver Function Tests: No results for input(s): "AST", "ALT", "ALKPHOS",  "BILITOT", "PROT", "ALBUMIN" in the last 168 hours. Cardiac Enzymes: No results for input(s): "CKTOTAL", "CKMB", "CKMBINDEX", "TROPONINI" in the last 168 hours. BNP (last 3 results) No results for input(s): "PROBNP" in the last 8760 hours. HbA1C: Recent Labs    12/21/21 1749  HGBA1C 4.6*   Sepsis Labs: @LABRCNTIP (procalcitonin:4,lacticidven:4) ) Recent Results (from the past 240 hour(s))  Aerobic Culture w Gram Stain (superficial specimen)     Status: None (Preliminary result)   Collection Time: 12/21/21  5:16 PM   Specimen: Wound  Result Value Ref Range Status   Specimen Description   Final    WOUND RIGHT FOREARM Performed at [facility], [place] [address] [place], [place] [address]    Special Requests   Final    NONE Performed at [facility], [address] [place], [place] [address]    Gram Stain   Final    NO WBC SEEN RARE GRAM POSITIVE COCCI IN PAIRS RARE GRAM POSITIVE RODS    Culture   Final    RARE STAPHYLOCOCCUS AUREUS CULTURE REINCUBATED FOR BETTER GROWTH Performed at [facility], [place] [address], [place], [place] [address]    Report Status PENDING  Incomplete  Culture, blood (Routine X 2) w Reflex to ID Panel     Status: None (Preliminary result)   Collection Time: 12/21/21  5:29 PM   Specimen: BLOOD RIGHT ARM  Result Value Ref Range Status   Specimen Description BLOOD RIGHT ARM  Final   Special Requests   Final    BOTTLES DRAWN AEROBIC ONLY Blood Culture adequate volume   Culture   Final    NO GROWTH < 24 HOURS Performed at [facility], [address] [place], [place] [address]    Report Status PENDING  Incomplete  Culture, blood (Routine X 2) w Reflex to ID Panel     Status: None (Preliminary result)   Collection Time: 12/21/21  5:49 PM   Specimen: BLOOD RIGHT ARM  Result Value Ref Range Status   Specimen Description BLOOD RIGHT ARM  Final   Special Requests   Final    BOTTLES DRAWN AEROBIC AND ANAEROBIC Blood Culture adequate volume    Culture   Final    NO GROWTH < 24 HOURS Performed at [facility], [address] [place], [place] [address]    Report Status PENDING  Incomplete  MRSA Next Gen by PCR, Nasal  Status: None   Collection Time: 12/22/21 12:52 AM   Specimen: Nasal Mucosa; Nasal Swab  Result Value Ref Range Status   MRSA by PCR Next Gen NOT DETECTED NOT DETECTED Final    Comment: (NOTE) The GeneXpert MRSA Assay (FDA approved for NASAL specimens only), is one component of a comprehensive MRSA colonization surveillance program. It is not intended to diagnose MRSA infection nor to guide or monitor treatment for MRSA infections. Test performance is not FDA approved in patients less than 87 years old. Performed at [facility], [address] [place], [place] [address]       Radiology Studies: CT FOREARM RIGHT W CONTRAST  Result Date: 12/21/2021 CLINICAL DATA:  Right forearm abscess EXAM: CT OF THE UPPER RIGHT EXTREMITY WITH CONTRAST TECHNIQUE: Multidetector CT imaging of the upper right extremity was performed according to the standard protocol following intravenous contrast administration. RADIATION DOSE REDUCTION: This exam was performed according to the departmental dose-optimization program which includes automated exposure control, adjustment of the mA and/or kV according to patient size and/or use of iterative reconstruction technique. CONTRAST:  31m OMNIPAQUE IOHEXOL 300 MG/ML  SOLN COMPARISON:  None Available. FINDINGS: Bones/Joint/Cartilage Normal alignment. No fracture or dislocation. No erosions or abnormal periosteal reaction. Ligaments Suboptimally assessed by CT. Muscles and Tendons The ill-defined dorsal fluid collection involving the distal left forearm is intimately associated with the muscle bellies of the extensor musculature, in particular the adductor pollicis longus, extensor pollicis longus, and extensor digiti minimi. The ill-defined fluid collection extends from the subdermal layer into  the muscle belly itself and measures roughly 2.2 x 3.7 x 4.6 cm in size. No well-defined rim enhancing margin is identified. The inflammatory changes do not extend beyond the subjacent interosseous membrane and there are no inflammatory changes identified within the anterior compartment of the forearm. Soft tissues There is extensive soft tissue infiltration in keeping within edema or inflammatory infiltration within the subcutaneous fat of the extensor surface surrounding the ill-defined fluid collection. These inflammatory changes extend along the ulnar aspect of the forearm to the olecranon. No retained radiopaque foreign body is identified. No subcutaneous gas is identified. IMPRESSION: 1. Ill-defined 4.6 cm dorsal fluid collection involving the distal left forearm intimately associated with the extensor musculature of the distal left forearm. No well-defined rim-enhancing margin is identified. The inflammatory changes do not extend beyond the subjacent interosseous membrane and there are no inflammatory changes identified within the anterior compartment of the forearm. The findings are most in keeping with a developing soft tissue abscess/phlegmon. No associated osseous abnormality. 2. Extensive soft tissue infiltration in keeping with edema or inflammatory infiltration within the subcutaneous fat of the extensor surface surrounding the ill-defined fluid collection. Electronically Signed   By: AFidela SalisburyM.D.   On: 12/21/2021 17:49   DG Forearm Right  Result Date: 12/21/2021 CLINICAL DATA:  Dorsal wrist abscess. EXAM: RIGHT FOREARM - 2 VIEW COMPARISON:  None Available. FINDINGS: The wrist and elbow joints are maintained. No acute bony findings are identified. There is a large subcutaneous soft tissue protuberance noted along the dorsal aspect of the distal forearm/wrist. IMPRESSION: No bony abnormalities. Electronically Signed   By: PMarijo SanesM.D.   On: 12/21/2021 14:03     Scheduled Meds:   chlorhexidine   Topical BID   Continuous Infusions:  vancomycin Stopped (12/22/21 0645)     LOS: 1 day    CRoxan HockeyM.D on 12/22/2021 at 6:02 PM  Go to [email or web address] - for contact info  [facility] - Office  [phone]  If 7PM-7AM, please contact night-coverage [email or web address] 12/22/2021, 6:02 PM

## 2021-12-22 NOTE — TOC Progression Note (Signed)
  Transition of Care [facility]) Screening Note   Patient Details  Name: Dorothy Strong Date of Birth: May 28, 1986   Transition of Care [facility]) CM/SW Contact:    Leitha Bleak, RN Phone Number: 12/22/2021, 10:20 AM    Transition of Care Department [facility]) has reviewed patient and no TOC needs have been identified at this time. We will continue to monitor patient advancement through interdisciplinary progression rounds. If new patient transition needs arise, please place a TOC consult.    Expected Discharge Plan: Home/Self Care Barriers to Discharge: Continued Medical Work up  Expected Discharge Plan and Services Expected Discharge Plan: Home/Self Care

## 2021-12-22 NOTE — Anesthesia Procedure Notes (Addendum)
Procedure Name: LMA Insertion Date/Time: 12/22/2021 1:22 PM  Performed by: Julian Reil, CRNAPre-anesthesia Checklist: Patient identified, Emergency Drugs available, Suction available and Patient being monitored Patient Re-evaluated:Patient Re-evaluated prior to induction Oxygen Delivery Method: Circle system utilized Preoxygenation: Pre-oxygenation with 100% oxygen Induction Type: IV induction LMA: LMA inserted LMA Size: 4.0 Tube type: Oral Number of attempts: 1 Placement Confirmation: positive ETCO2 Tube secured with: Tape Dental Injury: Teeth and Oropharynx as per pre-operative assessment  Comments: Very poor dentition noted Pre-op

## 2021-12-22 NOTE — Consult Note (Signed)
ORTHOPAEDIC CONSULTATION  REQUESTING PHYSICIAN: Emokpae, Courage, MD  ASSESSMENT AND PLAN: 36 y.o. female with the following: Right forearm abscess  Orthopedics recommends admission to a medical service and we will provide consultation and follow along  - Weight Bearing Status/Activity: WBAT  - Additional recommended labs/tests: ESR and CRP   -VTE Prophylaxis: As needed  - Pain control: As needed; PO medications preferred  - Follow-up plan: TBD  -Procedures: Right forearm I&D  DOS 12/22/21  Please keep patient NPO  Chief Complaint: Right arm pain  HPI: Dorothy Strong is a 36 y.o. female with pain in her right forearm.  She noticed a "pimple" about a week ago.  She has a history of boils, but never had an abscess.  A friend recommended she cover it in fat back, so she placed the fat back on her forearm and when she removed it in the morning, she noticed lots of drainage and an area of black scabbing.  It has progressively worsened.  Temperature up to 100.8 at home.  Some chills.  Limited motion in her hand.   Over night, she has had a lot of drainage from the abscess.  It feels better, but still has a lot of pain.  She had some chills over night.   Past Medical History:  Diagnosis Date   Abnormal Pap smear of cervix    Condyloma acuminatum    Depression    Past Surgical History:  Procedure Laterality Date   ADENOIDECTOMY     CERVICAL CONIZATION W/BX N/A 02/17/2014   Procedure: CONIZATION CERVIX WITH BIOPSY;  Surgeon: Jonnie Kind, MD;  Location: [facility];  Service: Gynecology;  Laterality: N/A;   COLPOSCOPY     TONSILLECTOMY     Social History   Socioeconomic History   Marital status: Single    Spouse name: Not on file   Number of children: Not on file   Years of education: Not on file   Highest education level: Not on file  Occupational History   Not on file  Tobacco Use   Smoking status: Every Day    Packs/day: 1.00    Years: 8.00    Total pack years: 8.00     Types: Cigarettes   Smokeless tobacco: Never  Substance and Sexual Activity   Alcohol use: No   Drug use: No   Sexual activity: Yes    Partners: Male    Birth control/protection: Pill  Other Topics Concern   Not on file  Social History Narrative   Not on file   Social Determinants of Health   Financial Resource Strain: Not on file  Food Insecurity: Not on file  Transportation Needs: Not on file  Physical Activity: Not on file  Stress: Not on file  Social Connections: Not on file   Family History  Problem Relation Age of Onset   Hypertension Father    Cancer Other        prostate    Diabetes Other    Arthritis Other    Hypertension Other    No Known Allergies Prior to Admission medications   Medication Sig Start Date End Date Taking? Authorizing Provider  gabapentin (NEURONTIN) 300 MG capsule Take 1 capsule by mouth daily as needed (nerve pain). 04/15/18  Yes [provider]  meloxicam (MOBIC) 7.5 MG tablet Take 1 tablet (7.5 mg total) by mouth daily. 11/08/21  Yes Evalee Jefferson, PA-C   CT FOREARM RIGHT W CONTRAST  Result Date: 12/21/2021 CLINICAL DATA:  Right forearm abscess EXAM: CT OF THE UPPER RIGHT EXTREMITY WITH CONTRAST TECHNIQUE: Multidetector CT imaging of the upper right extremity was performed according to the standard protocol following intravenous contrast administration. RADIATION DOSE REDUCTION: This exam was performed according to the departmental dose-optimization program which includes automated exposure control, adjustment of the mA and/or kV according to patient size and/or use of iterative reconstruction technique. CONTRAST:  30m OMNIPAQUE IOHEXOL 300 MG/ML  SOLN COMPARISON:  None Available. FINDINGS: Bones/Joint/Cartilage Normal alignment. No fracture or dislocation. No erosions or abnormal periosteal reaction. Ligaments Suboptimally assessed by CT. Muscles and Tendons The ill-defined dorsal fluid collection involving the distal left forearm is  intimately associated with the muscle bellies of the extensor musculature, in particular the adductor pollicis longus, extensor pollicis longus, and extensor digiti minimi. The ill-defined fluid collection extends from the subdermal layer into the muscle belly itself and measures roughly 2.2 x 3.7 x 4.6 cm in size. No well-defined rim enhancing margin is identified. The inflammatory changes do not extend beyond the subjacent interosseous membrane and there are no inflammatory changes identified within the anterior compartment of the forearm. Soft tissues There is extensive soft tissue infiltration in keeping within edema or inflammatory infiltration within the subcutaneous fat of the extensor surface surrounding the ill-defined fluid collection. These inflammatory changes extend along the ulnar aspect of the forearm to the olecranon. No retained radiopaque foreign body is identified. No subcutaneous gas is identified. IMPRESSION: 1. Ill-defined 4.6 cm dorsal fluid collection involving the distal left forearm intimately associated with the extensor musculature of the distal left forearm. No well-defined rim-enhancing margin is identified. The inflammatory changes do not extend beyond the subjacent interosseous membrane and there are no inflammatory changes identified within the anterior compartment of the forearm. The findings are most in keeping with a developing soft tissue abscess/phlegmon. No associated osseous abnormality. 2. Extensive soft tissue infiltration in keeping with edema or inflammatory infiltration within the subcutaneous fat of the extensor surface surrounding the ill-defined fluid collection. Electronically Signed   By: AFidela SalisburyM.D.   On: 12/21/2021 17:49   DG Forearm Right  Result Date: 12/21/2021 CLINICAL DATA:  Dorsal wrist abscess. EXAM: RIGHT FOREARM - 2 VIEW COMPARISON:  None Available. FINDINGS: The wrist and elbow joints are maintained. No acute bony findings are identified.  There is a large subcutaneous soft tissue protuberance noted along the dorsal aspect of the distal forearm/wrist. IMPRESSION: No bony abnormalities. Electronically Signed   By: PMarijo SanesM.D.   On: 12/21/2021 14:03   Family History Reviewed and non-contributory, no pertinent history of problems with bleeding or anesthesia    Review of Systems Fevers and chills No numbness or tingling No chest pain No shortness of breath No bowel or bladder dysfunction No GI distress No headaches    OBJECTIVE  Vitals:Patient Vitals for the past 8 hrs:  BP Temp Pulse Resp SpO2  12/22/21 0558 102/85 97.8 F (36.6 C) (!) 104 20 100 %  12/22/21 0216 95/65 98 F (36.7 C) 83 18 100 %   General: Alert, no acute distress Cardiovascular: Warm extremities noted Respiratory: No cyanosis, no use of accessory musculature GI: No organomegaly, abdomen is soft and non-tender Skin: No lesions in the area of chief complaint other than those listed below in MSK exam.  Neurologic: Sensation intact distally save for the below mentioned MSK exam Psychiatric: Patient is competent for consent with normal mood and affect Lymphatic: No swelling obvious and reported other than the area involved in  the exam below Extremities  RUE:  Sensation intact throughout her right hand.  Able to extend her fingers with limited ROM of her wrist.         Test Results Imaging   XR negative, forearm swelling  CT scan with fluid collection on dorsum of forearm, superficial  Labs cbc Recent Labs    12/21/21 1337 12/22/21 0501  WBC 13.5* 8.9  HGB 13.1 11.7*  HCT 38.5 35.6*  PLT 247 248    Labs inflam No results for input(s): "CRP" in the last 72 hours.  Invalid input(s): "ESR"  Labs coag No results for input(s): "INR", "PTT" in the last 72 hours.  Invalid input(s): "PT"  Recent Labs    12/21/21 1337 12/21/21 1749 12/22/21 0501  NA 133*  --  138  K 5.3* 3.6 3.9  CL 103  --  111  CO2 21*  --  22   GLUCOSE 118*  --  92  BUN 12  --  9  CREATININE 0.51  --  0.41*  CALCIUM 8.6*  --  8.3*

## 2021-12-22 NOTE — Anesthesia Preprocedure Evaluation (Addendum)
Anesthesia Evaluation  Patient identified by MRN, date of birth, ID band Patient awake    Reviewed: Allergy & Precautions, H&P , NPO status , Patient's Chart, lab work & pertinent test results, reviewed documented beta blocker date and time   Airway Mallampati: II  TM Distance: >3 FB Neck ROM: full    Dental  (+) Dental Advisory Given, Missing, Chipped   Pulmonary neg pulmonary ROS, Current Smoker and Patient abstained from smoking.,    Pulmonary exam normal breath sounds clear to auscultation       Cardiovascular Exercise Tolerance: Good negative cardio ROS   Rhythm:regular Rate:Normal     Neuro/Psych PSYCHIATRIC DISORDERS Depression negative neurological ROS     GI/Hepatic negative GI ROS, (+)     substance abuse  marijuana use and IV drug use,   Endo/Other  negative endocrine ROS  Renal/GU negative Renal ROS  negative genitourinary   Musculoskeletal  (+) narcotic dependent  Abdominal   Peds  Hematology negative hematology ROS (+)   Anesthesia Other Findings   Reproductive/Obstetrics negative OB ROS                            Anesthesia Physical Anesthesia Plan  ASA: 4 and emergent  Anesthesia Plan: General and General LMA   Post-op Pain Management:    Induction:   PONV Risk Score and Plan: Ondansetron  Airway Management Planned:   Additional Equipment:   Intra-op Plan:   Post-operative Plan:   Informed Consent: I have reviewed the patients History and Physical, chart, labs and discussed the procedure including the risks, benefits and alternatives for the proposed anesthesia with the patient or authorized representative who has indicated his/her understanding and acceptance.     Dental Advisory Given  Plan Discussed with: CRNA  Anesthesia Plan Comments:         Anesthesia Quick Evaluation

## 2021-12-22 NOTE — Op Note (Signed)
Orthopaedic Surgery Operative Note (CSN: [id number])  Dorothy Strong  [birth year] Date of Surgery: 12/22/2021   Diagnoses:  Right forearm abscess  Procedure: Incision and drainage of right forearm abscess.   Operative Finding Successful completion of the planned procedure.  Counterincision made to drain additional purulent material from right forearm abscess.  There was an eschar, with surrounding friable tissue and this was gently debrided.  7 cm of Penrose drain was left in place.  This pocket measured approximately 6 cm from proximal to distal and 5 cm from medial to lateral.  Post-Op Diagnosis: Same Surgeons:Primary: Oliver Barre, MD Assistants: Rebeca Alert Location: [facility] OR ROOM 3 Anesthesia: General Antibiotics:  Patient has been receiving antibiotics on the floor.  No additional antibiotics needed prior to incision. Tourniquet time:  Total Tourniquet Time Documented: Upper Arm (Right) - 16 minutes Total: Upper Arm (Right) - 16 minutes  Estimated Blood Loss: Minimal Complications: None Specimens:  Culture swab with purulent material was sent for culture  Implants: * No implants in log *  Indications for Surgery:   Dorothy Strong is a 36 y.o. female with a large, painful forearm abscess, on the dorsal aspect of the right forearm.  It was draining purulent material, but she continued to have significant pain and some chills overnight.  We discussed proceeding with incision and drainage, and she was in agreement with this plan.  She is not a good candidate to do this under local anesthesia, on the floor.  Benefits and risks of operative and nonoperative management were discussed prior to surgery with the patient and informed consent form was completed.  Specific risks including infection, need for additional surgery, bleeding, persistent pain, need for wound coverage and more severe complications associated with anesthesia.  She elected to proceed.  Surgical consent was  finalized.   Procedure:   The patient was identified properly. Informed consent was obtained and the surgical site was marked. The patient was taken to the OR where general anesthesia was induced.  The patient was positioned supine with the use of a hand table.  The right arm was prepped and draped in the usual sterile fashion.  Timeout was performed before the beginning of the case.  Tourniquet was used for the above duration.  Patient has a large fluid collection on the dorsal aspect of the distal forearm.  There was an eschar, with surrounding friable tissue.  Over the last 12 hours, the abscess had been incompletely decompressed, spontaneously through the surrounding tissue.  We made plans to make a counterincision radial to the eschar.  We made an approximately 3 cm incision, and incised sharply through skin.  We then used a hemostat to bluntly dissect into the remaining cavity of the abscess.  We are able to express purulent material.  At this point, most of the abscess had been decompressed.  Nonetheless, we continue to massage the surrounding tissue, and express more purulence.  We collected a culture swab, and sent this to the lab.  We gently debrided the tissue with a clean laparotomy sponge.  We then proceeded to irrigate the cavity with 3 L of normal saline.  The cavity was then evaluated, and there was no obvious purulence remaining.  There was a counterincision, as well as a portion of the eschar had been debrided.  We placed a Penrose drain through the counterincision and into the abscess pocket.  We then loosely closed the counterincision with multiple interrupted sutures.  A dry dressing was placed.  The tourniquet was let down.  Fingers quickly regained their color.  The patient was awakened and taken to the PACU in stable condition.  Post-operative plan:  The patient will be returned to the floor. She will continue on her current regimen of antibiotics. We will continue to monitor the  cultures in order to narrow our choices of antibiotics. Penrose drain will be removed with a dressing change in approximately 48 hours DVT prophylaxis not indicated in this ambulatory upper extremity patient without significant risk factors.    Pain control with PRN pain medication preferring oral medicines.   Follow up plan will be scheduled in approximately 7 days for incision check and suture removal.

## 2021-12-22 NOTE — Plan of Care (Signed)

## 2021-12-22 NOTE — Transfer of Care (Signed)
Immediate Anesthesia Transfer of Care Note  Patient: Dorothy Strong  Procedure(s) Performed: IRRIGATION AND DEBRIDEMENT EXTREMITY (Right: Arm Lower)  Patient Location: PACU  Anesthesia Type:General  Level of Consciousness: drowsy  Airway & Oxygen Therapy: Patient Spontanous Breathing  Post-op Assessment: Report given to RN  Post vital signs: Reviewed and stable  Last Vitals:  Vitals Value Taken Time  BP 105/76 12/22/21 1400  Temp    Pulse 78 12/22/21 1403  Resp 17 12/22/21 1403  SpO2 99 % 12/22/21 1403  Vitals shown include unvalidated device data.  Last Pain:  Vitals:   12/22/21 1237  TempSrc: Oral  PainSc: 6       Patients Stated Pain Goal: 8 (12/22/21 1237)  Complications: No notable events documented.

## 2021-12-23 DIAGNOSIS — L02413 Cutaneous abscess of right upper limb: Secondary | ICD-10-CM | POA: Diagnosis not present

## 2021-12-23 LAB — CBC
HCT: 32.1 % — ABNORMAL LOW (ref 36.0–46.0)
Hemoglobin: 10.8 g/dL — ABNORMAL LOW (ref 12.0–15.0)
MCH: 31.5 pg (ref 26.0–34.0)
MCHC: 33.6 g/dL (ref 30.0–36.0)
MCV: 93.6 fL (ref 80.0–100.0)
Platelets: 247 10*3/uL (ref 150–400)
RBC: 3.43 MIL/uL — ABNORMAL LOW (ref 3.87–5.11)
RDW: 13.1 % (ref 11.5–15.5)
WBC: 12.1 10*3/uL — ABNORMAL HIGH (ref 4.0–10.5)
nRBC: 0 % (ref 0.0–0.2)

## 2021-12-23 LAB — BASIC METABOLIC PANEL
Anion gap: 5 (ref 5–15)
BUN: 16 mg/dL (ref 6–20)
CO2: 21 mmol/L — ABNORMAL LOW (ref 22–32)
Calcium: 8.4 mg/dL — ABNORMAL LOW (ref 8.9–10.3)
Chloride: 112 mmol/L — ABNORMAL HIGH (ref 98–111)
Creatinine, Ser: 0.39 mg/dL — ABNORMAL LOW (ref 0.44–1.00)
GFR, Estimated: >60 mL/min (ref >60)
Glucose, Bld: 120 mg/dL — ABNORMAL HIGH (ref 70–99)
Potassium: 3.1 mmol/L — ABNORMAL LOW (ref 3.5–5.1)
Sodium: 138 mmol/L (ref 135–145)

## 2021-12-23 MED ORDER — LOPERAMIDE HCL 2 MG PO CAPS
2.0000 mg | ORAL_CAPSULE | Freq: Four times a day (QID) | ORAL | Status: DC | PRN
  Administered 2021-12-23: 2 mg via ORAL
  Filled 2021-12-23: qty 1

## 2021-12-23 MED ORDER — POTASSIUM CHLORIDE CRYS ER 20 MEQ PO TBCR
40.0000 meq | EXTENDED_RELEASE_TABLET | ORAL | Status: AC
  Administered 2021-12-23 (×2): 40 meq via ORAL
  Filled 2021-12-23 (×2): qty 2

## 2021-12-23 NOTE — Anesthesia Postprocedure Evaluation (Signed)
Anesthesia Post Note  Patient: Dorothy Strong  Procedure(s) Performed: IRRIGATION AND DEBRIDEMENT EXTREMITY (Right: Arm Lower)  Patient location during evaluation: Phase II Anesthesia Type: General Level of consciousness: awake Pain management: pain level controlled Vital Signs Assessment: post-procedure vital signs reviewed and stable Respiratory status: spontaneous breathing and respiratory function stable Cardiovascular status: blood pressure returned to baseline and stable Postop Assessment: no headache and no apparent nausea or vomiting Anesthetic complications: no Comments: Late entry   No notable events documented.   Last Vitals:  Vitals:   12/22/21 2203 12/23/21 0426  BP: 116/79 (!) 123/97  Pulse: 97 86  Resp: 20 18  Temp: 36.8 C 36.8 C  SpO2: 99% 99%    Last Pain:  Vitals:   12/23/21 1356  TempSrc:   PainSc: 7                  Windell Norfolk

## 2021-12-23 NOTE — Progress Notes (Signed)
PROGRESS NOTE     Dorothy Strong, is a 36 y.o. female, DOB - [birth year], TWS:[id number]  Admit date - 12/21/2021   Admitting Physician Bethena Roys, MD  Outpatient Primary MD for the patient is Pcp, No  LOS - 2  Chief Complaint  Patient presents with   Abscess        -Assessment and Plan: 1) sepsis secondary to abscess of right forearm --- s/p Incision and drainage of right forearm abscess on 12/22/21 by Dr Amedeo Kinsman -Penrose drain in situ -ESR was 25, CRP is 9.5 -WBC currently 12.1 from a peak of 13.5, -Continue IV vancomycin pending OR wound cultures -Orthopedic surgeon plans to possibly remove Penrose drain on 12/24/2021, possible discharge home on 12/24/2021 on oral antibiotics pending culture data -Initial wound culture and blood cultures from 12/21/2021 [facility] -OR wound culture from 12/22/2021 [facility] -Urine culture from 12/22/2021 [facility]  2)Polysubstance abuse ([place]) -Suspect/probably related to IV drug use -UDS positive for benzos opiates amphetamines and THC -TOC referral for drug rehab info  Disposition/Need for in-Hospital Stay- patient unable to be discharged at this time due to -sepsis secondary to right forearm abscess status post I&D, requiring IV antibiotics pending further culture data --Orthopedic surgeon plans to possibly remove Penrose drain on 12/24/2021, possible discharge home on 12/24/2021 on oral antibiotics pending culture data  Status is: Inpatient   Disposition: The patient is from: Home              Anticipated d/c is to: Home              Anticipated d/c date is: 1 day              Patient currently is not medically stable to d/c. Barriers: Not Clinically Stable-   Code Status :  -  Code Status: Full Code   Family Communication:    NA (patient is alert, awake and coherent)   DVT Prophylaxis  :   - SCDs  SCDs Start: 12/21/21 2015  Lab Results  Component Value Date   PLT 247 12/23/2021   Inpatient Medications  Scheduled Meds:  Continuous  Infusions:  vancomycin 750 mg (12/23/21 0559)   PRN Meds:.acetaminophen **OR** acetaminophen, gabapentin, ketorolac, loperamide, ondansetron **OR** ondansetron (ZOFRAN) IV, polyethylene glycol   Anti-infectives (From admission, onward)    Start     Dose/Rate Route Frequency Ordered Stop   12/22/21 0600  vancomycin (VANCOREADY) IVPB 750 mg/150 mL        750 mg 150 mL/hr over 60 Minutes Intravenous Every 12 hours 12/21/21 2030     12/21/21 1615  vancomycin (VANCOCIN) IVPB 1000 mg/200 mL premix        1,000 mg 200 mL/hr over 60 Minutes Intravenous  Once 12/21/21 1606 12/21/21 1748        Subjective: Dorothy Strong today has no fevers, no emesis,  No chest pain,   - -Right forearm pain and swelling is not worse -Complains of episode of loose stool without blood or mucus  Objective: Vitals:   12/22/21 1427 12/22/21 1430 12/22/21 2203 12/23/21 0426  BP:  118/89 116/79 (!) 123/97  Pulse: 83 74 97 86  Resp: 18 11 20 18   Temp:   98.2 F (36.8 C) 98.2 F (36.8 C)  TempSrc:   Oral Oral  SpO2: 100% 100% 99% 99%  Weight:      Height:        Intake/Output Summary (Last 24 hours) at 12/23/2021 1556 Last data filed at 12/23/2021 1300 Gross  per 24 hour  Intake 593.45 ml  Output --  Net 593.45 ml   Filed Weights   12/21/21 1254 12/21/21 1800  Weight: 56.7 kg 52.6 kg    Physical Exam  Gen:- Awake Alert, cooperative, in no acute distress HEENT:- [place].AT, No sclera icterus, multiple missing teeth poor dental hygiene and caries Neck-Supple Neck,No JVD,.  Lungs-  CTAB , fair symmetrical air movement CV- S1, S2 normal, regular  Abd-  +ve B.Sounds, Abd Soft, No tenderness,    Psych-affect is appropriate, oriented x3 Neuro-no new focal deficits, no tremors MSK-right forearm with dressing on postop, swelling erythema tenderness and warmth noted  Data Reviewed: I have personally reviewed following labs and imaging studies  CBC: Recent Labs  Lab 12/21/21 1337 12/22/21 0501  12/23/21 0517  WBC 13.5* 8.9 12.1*  NEUTROABS 10.2*  --   --   HGB 13.1 11.7* 10.8*  HCT 38.5 35.6* 32.1*  MCV 90.4 93.2 93.6  PLT 247 248 128   Basic Metabolic Panel: Recent Labs  Lab 12/21/21 1337 12/21/21 1749 12/22/21 0501 12/23/21 0517  NA 133*  --  138 138  K 5.3* 3.6 3.9 3.1*  CL 103  --  111 112*  CO2 21*  --  22 21*  GLUCOSE 118*  --  92 120*  BUN 12  --  9 16  CREATININE 0.51  --  0.41* 0.39*  CALCIUM 8.6*  --  8.3* 8.4*   GFR: Estimated Creatinine Clearance: 82.3 mL/min (A) (by C-G formula based on SCr of 0.39 mg/dL (L)). Liver Function Tests: No results for input(s): "AST", "ALT", "ALKPHOS", "BILITOT", "PROT", "ALBUMIN" in the last 168 hours. Cardiac Enzymes: No results for input(s): "CKTOTAL", "CKMB", "CKMBINDEX", "TROPONINI" in the last 168 hours. BNP (last 3 results) No results for input(s): "PROBNP" in the last 8760 hours. HbA1C: Recent Labs    12/21/21 1749  HGBA1C 4.6*   Sepsis Labs: @LABRCNTIP (procalcitonin:4,lacticidven:4) ) Recent Results (from the past 240 hour(s))  Aerobic Culture w Gram Stain (superficial specimen)     Status: None (Preliminary result)   Collection Time: 12/21/21  5:16 PM   Specimen: Wound  Result Value Ref Range Status   Specimen Description   Final    WOUND RIGHT FOREARM Performed at [facility], [place] [address] [place], [place] [address]    Special Requests   Final    NONE Performed at [facility], [address], [place], [place] [address]    Gram Stain   Final    NO WBC SEEN RARE GRAM POSITIVE COCCI IN PAIRS RARE GRAM POSITIVE RODS    Culture   Final    RARE STAPHYLOCOCCUS AUREUS SUSCEPTIBILITIES TO FOLLOW Performed at [facility], [place] [address], [place], [place] [address]    Report Status PENDING  Incomplete  Culture, blood (Routine X 2) w Reflex to ID Panel     Status: None (Preliminary result)   Collection Time: 12/21/21  5:29 PM   Specimen: BLOOD RIGHT ARM  Result Value Ref Range  Status   Specimen Description BLOOD RIGHT ARM  Final   Special Requests   Final    BOTTLES DRAWN AEROBIC ONLY Blood Culture adequate volume   Culture   Final    NO GROWTH 2 DAYS Performed at [facility], [address] [place], [place] [address]    Report Status PENDING  Incomplete  Culture, blood (Routine X 2) w Reflex to ID Panel     Status: None (Preliminary result)  Collection Time: 12/21/21  5:49 PM   Specimen: BLOOD RIGHT ARM  Result Value Ref Range Status   Specimen Description BLOOD RIGHT ARM  Final   Special Requests   Final    BOTTLES DRAWN AEROBIC AND ANAEROBIC Blood Culture adequate volume   Culture   Final    NO GROWTH 2 DAYS Performed at [facility], [address], [place], [place] [address]    Report Status PENDING  Incomplete  MRSA Next Gen by PCR, Nasal     Status: None   Collection Time: 12/22/21 12:52 AM   Specimen: Nasal Mucosa; Nasal Swab  Result Value Ref Range Status   MRSA by PCR Next Gen NOT DETECTED NOT DETECTED Final    Comment: (NOTE) The GeneXpert MRSA Assay (FDA approved for NASAL specimens only), is one component of a comprehensive MRSA colonization surveillance program. It is not intended to diagnose MRSA infection nor to guide or monitor treatment for MRSA infections. Test performance is not FDA approved in patients less than 20 years old. Performed at [facility], [address], [place], [place] [address]   Aerobic/Anaerobic Culture w Gram Stain (surgical/deep wound)     Status: None (Preliminary result)   Collection Time: 12/22/21  1:38 PM   Specimen: PATH Other; Tissue  Result Value Ref Range Status   Specimen Description   Final    ABSCESS Performed at [facility], [address], [place], [place] [address]    Special Requests   Final    RIGHT FOREARM Performed at [facility], [address], [place], [place] [address]    Gram Stain   Final    NO SQUAMOUS EPITHELIAL CELLS SEEN FEW WBC SEEN NO ORGANISMS SEEN    Culture   Final     NO GROWTH < 24 HOURS Performed at [facility], [place] [address], [place], [place] [address]    Report Status PENDING  Incomplete      Radiology Studies: CT FOREARM RIGHT W CONTRAST  Result Date: 12/21/2021 CLINICAL DATA:  Right forearm abscess EXAM: CT OF THE UPPER RIGHT EXTREMITY WITH CONTRAST TECHNIQUE: Multidetector CT imaging of the upper right extremity was performed according to the standard protocol following intravenous contrast administration. RADIATION DOSE REDUCTION: This exam was performed according to the departmental dose-optimization program which includes automated exposure control, adjustment of the mA and/or kV according to patient size and/or use of iterative reconstruction technique. CONTRAST:  87m OMNIPAQUE IOHEXOL 300 MG/ML  SOLN COMPARISON:  None Available. FINDINGS: Bones/Joint/Cartilage Normal alignment. No fracture or dislocation. No erosions or abnormal periosteal reaction. Ligaments Suboptimally assessed by CT. Muscles and Tendons The ill-defined dorsal fluid collection involving the distal left forearm is intimately associated with the muscle bellies of the extensor musculature, in particular the adductor pollicis longus, extensor pollicis longus, and extensor digiti minimi. The ill-defined fluid collection extends from the subdermal layer into the muscle belly itself and measures roughly 2.2 x 3.7 x 4.6 cm in size. No well-defined rim enhancing margin is identified. The inflammatory changes do not extend beyond the subjacent interosseous membrane and there are no inflammatory changes identified within the anterior compartment of the forearm. Soft tissues There is extensive soft tissue infiltration in keeping within edema or inflammatory infiltration within the subcutaneous fat of the extensor surface surrounding the ill-defined fluid collection. These inflammatory changes extend along the ulnar aspect of the forearm to the olecranon. No retained radiopaque foreign  body is identified. No subcutaneous gas is identified. IMPRESSION: 1. Ill-defined  4.6 cm dorsal fluid collection involving the distal left forearm intimately associated with the extensor musculature of the distal left forearm. No well-defined rim-enhancing margin is identified. The inflammatory changes do not extend beyond the subjacent interosseous membrane and there are no inflammatory changes identified within the anterior compartment of the forearm. The findings are most in keeping with a developing soft tissue abscess/phlegmon. No associated osseous abnormality. 2. Extensive soft tissue infiltration in keeping with edema or inflammatory infiltration within the subcutaneous fat of the extensor surface surrounding the ill-defined fluid collection. Electronically Signed   By: Fidela Salisbury M.D.   On: 12/21/2021 17:49     Scheduled Meds:   Continuous Infusions:  vancomycin 750 mg (12/23/21 0559)     LOS: 2 days    Roxan Hockey M.D on 12/23/2021 at 3:56 PM  Go to [email or web address] - for contact info  [facility] - Office  [phone]  If 7PM-7AM, please contact night-coverage [email or web address] 12/23/2021, 3:56 PM

## 2021-12-23 NOTE — Progress Notes (Signed)
Right fingers edematous but normal movement, temperature and color. C/O pain rated about a 7 and received pain medication earlier.  C/O burning on urination.  Urine sample sent to lab. Has been ambulating to bathroom

## 2021-12-23 NOTE — Progress Notes (Signed)
   ORTHOPAEDIC PROGRESS NOTE  s/p Procedure(s): IRRIGATION AND DEBRIDEMENT EXTREMITY; right forearm abscess  DOS: 12/22/21  SUBJECTIVE: Continues to have pain in the forearm.  Trouble sleeping.  No fevers or chills.  She feels as though she has better motion.   OBJECTIVE: PE:  Alert and oriented  Dressing is clean, dry and intact Exposed fingers are warm and well perfused No evidence of erythema Sensation to all fingers is normal Active motion to all fingers is intact.     Vitals:   12/23/21 0426 12/23/21 1839  BP: (!) 123/97 102/69  Pulse: 86 84  Resp: 18 18  Temp: 98.2 F (36.8 C) 98.2 F (36.8 C)  SpO2: 99% 98%      Latest Ref Rng & Units 12/23/2021    5:17 AM 12/22/2021    5:01 AM 12/21/2021    1:37 PM  CBC  WBC 4.0 - 10.5 K/uL 12.1  8.9  13.5   Hemoglobin 12.0 - 15.0 g/dL 19.1  66.0  60.0   Hematocrit 36.0 - 46.0 % 32.1  35.6  38.5   Platelets 150 - 400 K/uL 247  248  247       ASSESSMENT: Dorothy Strong is a 36 y.o. female doing well postoperatively.  PLAN: Weightbearing: WBAT RUE Insicional and dressing care: Dressings left intact until follow-up.  Will remove penrose and change dressing on POD#2 Orthopedic device(s): None VTE prophylaxis: None needed in an isolated UE procedure, in patient without known risk factors.  Pain control: As needed Follow - up plan: 1 week.  Will see patient in clinic within a week to assess wound and remove sutures.   Continue with current Abx, can adjust based on results of culture.    Contact information:     Boss Danielsen A. [place] Schimke, MD MS [facility] [address] [place],  [place]  [address] Phone: [phone] Fax: [phone]

## 2021-12-24 DIAGNOSIS — L0291 Cutaneous abscess, unspecified: Secondary | ICD-10-CM | POA: Diagnosis not present

## 2021-12-24 LAB — BASIC METABOLIC PANEL
Anion gap: 4 — ABNORMAL LOW (ref 5–15)
BUN: 8 mg/dL (ref 6–20)
CO2: 23 mmol/L (ref 22–32)
Calcium: 8.5 mg/dL — ABNORMAL LOW (ref 8.9–10.3)
Chloride: 110 mmol/L (ref 98–111)
Creatinine, Ser: 0.45 mg/dL (ref 0.44–1.00)
GFR, Estimated: >60 mL/min (ref >60)
Glucose, Bld: 95 mg/dL (ref 70–99)
Potassium: 4 mmol/L (ref 3.5–5.1)
Sodium: 137 mmol/L (ref 135–145)

## 2021-12-24 LAB — AEROBIC CULTURE W GRAM STAIN (SUPERFICIAL SPECIMEN): Gram Stain: NONE SEEN

## 2021-12-24 LAB — CBC
HCT: 34.2 % — ABNORMAL LOW (ref 36.0–46.0)
Hemoglobin: 11.5 g/dL — ABNORMAL LOW (ref 12.0–15.0)
MCH: 31.1 pg (ref 26.0–34.0)
MCHC: 33.6 g/dL (ref 30.0–36.0)
MCV: 92.4 fL (ref 80.0–100.0)
Platelets: 248 10*3/uL (ref 150–400)
RBC: 3.7 MIL/uL — ABNORMAL LOW (ref 3.87–5.11)
RDW: 13.4 % (ref 11.5–15.5)
WBC: 7.4 10*3/uL (ref 4.0–10.5)
nRBC: 0 % (ref 0.0–0.2)

## 2021-12-24 MED ORDER — CEPHALEXIN 500 MG PO CAPS: 500.0000 mg | ORAL_CAPSULE | Freq: Three times a day (TID) | ORAL | 0 refills | Status: DC

## 2021-12-24 MED ORDER — MELOXICAM 7.5 MG PO TABS: 7.5000 mg | ORAL_TABLET | Freq: Every day | ORAL | 0 refills | Status: AC

## 2021-12-24 MED ORDER — DOXYCYCLINE HYCLATE 100 MG PO TABS: 100.0000 mg | ORAL_TABLET | Freq: Two times a day (BID) | ORAL | 0 refills | Status: DC

## 2021-12-24 MED ORDER — ACETAMINOPHEN 325 MG PO TABS: 650.0000 mg | ORAL_TABLET | Freq: Four times a day (QID) | ORAL | 0 refills | Status: AC | PRN
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
## 2021-12-24 NOTE — Progress Notes (Signed)
Pharmacy Antibiotic Note  Dorothy Strong is a 36 y.o. female admitted on 12/21/2021 with sepsis 2/2 right forearm abscess.  Pharmacy has been consulted for vancomycin dosing.  Plan: Vancomycin 750 IV every 12 hours.  Estimated AUC: 545 mcg*hr/mL Creatinine used: 0.8 mg/dL (rounded up from 0.17 mg/dL) Estimated Ke: 7.939 hr-1 Half-life: 9.5 hours  Height: 5\' 5"  (165.1 cm) Weight: 52.6 kg (115 lb 14.4 oz) IBW/kg (Calculated) : 57  Temp (24hrs), Avg:98.1 F (36.7 C), Min:97.9 F (36.6 C), Max:98.2 F (36.8 C)  Recent Labs  Lab 12/21/21 1337 12/21/21 1749 12/22/21 0501 12/23/21 0517 12/24/21 0809  WBC 13.5*  --  8.9 12.1* 7.4  CREATININE 0.51  --  0.41* 0.39* 0.45  LATICACIDVEN  --  0.8  --   --   --      Estimated Creatinine Clearance: 82.3 mL/min (by C-G formula based on SCr of 0.45 mg/dL).    No Known Allergies  Antimicrobials this admission: vanco 6/14 >>    Microbiology results: 6/14 BCx: in process, prelim no growth 6/14 MRSA PCR  Thank you for allowing pharmacy to be a part of this patient's care.  7/14 BS, PharmD, BCPS Clinical Pharmacist 12/24/2021 9:31 AM

## 2021-12-24 NOTE — Progress Notes (Signed)
   ORTHOPAEDIC PROGRESS NOTE  s/p Procedure(s): IRRIGATION AND DEBRIDEMENT EXTREMITY; right forearm abscess  DOS: 12/22/21  SUBJECTIVE: Complaining of pain this morning.  She states that she feels like she slept on her arm.  No numbness or tingling.  No fevers or chills.   OBJECTIVE: PE:  Alert and oriented  Dorsal forearm incision is intact.   Penrose drain stuck to soft tissue Necrotic, fibrinous tissue at site of previous eschar.  Malodorous Able to extend all fingers Minimal surrounding redness No swelling on the dorsum of the hand Sensation to all fingers is normal Active motion to all fingers is intact.     Vitals:   12/23/21 2236 12/24/21 0516  BP: 100/70 106/71  Pulse: 78 70  Resp: 18 20  Temp:  97.9 F (36.6 C)  SpO2: 99% 100%      Latest Ref Rng & Units 12/24/2021    8:09 AM 12/23/2021    5:17 AM 12/22/2021    5:01 AM  CBC  WBC 4.0 - 10.5 K/uL 7.4  12.1  8.9   Hemoglobin 12.0 - 15.0 g/dL 55.0  15.8  68.2   Hematocrit 36.0 - 46.0 % 34.2  32.1  35.6   Platelets 150 - 400 K/uL 248  247  248       ASSESSMENT: Dorothy Strong is a 36 y.o. female doing well postoperatively.  Continues to have pain, but clinically she is getting better.   PLAN: Weightbearing: WBAT RUE Insicional and dressing care:  Penrose drain removed this morning with minimal difficulty.  Dressing should be changed daily.  Non-adherent dressing followed by gauze and kerlix or ACE wrap.   Orthopedic device(s): None VTE prophylaxis: None needed in an isolated UE procedure, in patient without known risk factors.  Pain control: As needed Follow - up plan: 1 week.  Will see patient in clinic within a week to assess wound and remove sutures.  Plan for 6/21  Continue with current Abx, can adjust based on results of culture.  If results necessitate a change, this can be addressed in clinic.  Patient can DC from orthopaedic perspective.    Contact information:     Jeyda Siebel A. Dallas Schimke, MD MS [facility] [address] [place],  [place]  [address] Phone: [phone] Fax: [phone]

## 2021-12-24 NOTE — Progress Notes (Signed)
Nsg Discharge Note  Admit Date:  12/21/2021 Discharge date: 12/24/2021   Mliss Sax to be D/C'd Home per MD order.  AVS completed.   Removed IV-CDI. Reviewed d/c paperwork with patient and answered all questions. Gave patient enough dressing supplies to last about 1 week. Stable patient and belongings walked with boyfriend to car.Patient/caregiver able to verbalize understanding.  Discharge Medication: Allergies as of 12/24/2021   No Known Allergies      Medication List     TAKE these medications    acetaminophen 325 MG tablet Commonly known as: TYLENOL Take 2 tablets (650 mg total) by mouth every 6 (six) hours as needed for mild pain (or Fever >/= 101).   cephALEXin 500 MG capsule Commonly known as: Keflex Take 1 capsule (500 mg total) by mouth 3 (three) times daily for 5 days.   doxycycline 100 MG tablet Commonly known as: VIBRA-TABS Take 1 tablet (100 mg total) by mouth 2 (two) times daily for 5 days.   gabapentin 300 MG capsule Commonly known as: NEURONTIN Take 1 capsule by mouth daily as needed (nerve pain).   meloxicam 7.5 MG tablet Commonly known as: Mobic Take 1 tablet (7.5 mg total) by mouth daily with breakfast. What changed: when to take this               Discharge Care Instructions  (From admission, onward)           Start     Ordered   12/24/21 0000  Discharge wound care:       Comments: Continue Daily Dressing Changes with Non-adherent dressing followed by gauze and kerlix or ACE wrap--   12/24/21 1106            Discharge Assessment: Vitals:   12/23/21 2236 12/24/21 0516  BP: 100/70 106/71  Pulse: 78 70  Resp: 18 20  Temp:  97.9 F (36.6 C)  SpO2: 99% 100%   Skin clean, dry and intact without evidence of skin break down, no evidence of skin tears noted. IV catheter discontinued intact. Site without signs and symptoms of complications - no redness or edema noted at insertion site, patient denies c/o pain - only slight  tenderness at site.  Dressing with slight pressure applied.  D/c Instructions-Education: Discharge instructions given to patient/family with verbalized understanding. D/c education completed with patient/family including follow up instructions, medication list, d/c activities limitations if indicated, with other d/c instructions as indicated by MD - patient able to verbalize understanding, all questions fully answered. Patient instructed to return to ED, call 911, or call MD for any changes in condition.  Patient escorted via WC, and D/C home via private auto.  Karolee Ohs, RN 12/24/2021 12:15 PM

## 2021-12-24 NOTE — Plan of Care (Signed)
  Problem: Education: Goal: Knowledge of General Education information will improve Description: Including pain rating scale, medication(s)/side effects and non-pharmacologic comfort measures 12/24/2021 1114 by Karolee Ohs, RN Outcome: Adequate for Discharge 12/24/2021 1114 by Karolee Ohs, RN Outcome: Progressing   Problem: Health Behavior/Discharge Planning: Goal: Ability to manage health-related needs will improve 12/24/2021 1114 by Karolee Ohs, RN Outcome: Adequate for Discharge 12/24/2021 1114 by Karolee Ohs, RN Outcome: Progressing   Problem: Clinical Measurements: Goal: Ability to maintain clinical measurements within normal limits will improve 12/24/2021 1114 by Karolee Ohs, RN Outcome: Adequate for Discharge 12/24/2021 1114 by Karolee Ohs, RN Outcome: Progressing Goal: Will remain free from infection 12/24/2021 1114 by Karolee Ohs, RN Outcome: Adequate for Discharge 12/24/2021 1114 by Karolee Ohs, RN Outcome: Progressing Goal: Diagnostic test results will improve 12/24/2021 1114 by Karolee Ohs, RN Outcome: Adequate for Discharge 12/24/2021 1114 by Karolee Ohs, RN Outcome: Progressing Goal: Respiratory complications will improve 12/24/2021 1114 by Karolee Ohs, RN Outcome: Adequate for Discharge 12/24/2021 1114 by Karolee Ohs, RN Outcome: Progressing Goal: Cardiovascular complication will be avoided 12/24/2021 1114 by Karolee Ohs, RN Outcome: Adequate for Discharge 12/24/2021 1114 by Karolee Ohs, RN Outcome: Progressing   Problem: Activity: Goal: Risk for activity intolerance will decrease 12/24/2021 1114 by Karolee Ohs, RN Outcome: Adequate for Discharge 12/24/2021 1114 by Karolee Ohs, RN Outcome: Progressing   Problem: Nutrition: Goal: Adequate nutrition will be maintained 12/24/2021 1114 by Karolee Ohs, RN Outcome: Adequate for Discharge 12/24/2021 1114 by Karolee Ohs, RN Outcome: Progressing   Problem:  Coping: Goal: Level of anxiety will decrease 12/24/2021 1114 by Karolee Ohs, RN Outcome: Adequate for Discharge 12/24/2021 1114 by Karolee Ohs, RN Outcome: Progressing   Problem: Elimination: Goal: Will not experience complications related to bowel motility 12/24/2021 1114 by Karolee Ohs, RN Outcome: Adequate for Discharge 12/24/2021 1114 by Karolee Ohs, RN Outcome: Progressing Goal: Will not experience complications related to urinary retention 12/24/2021 1114 by Karolee Ohs, RN Outcome: Adequate for Discharge 12/24/2021 1114 by Karolee Ohs, RN Outcome: Progressing   Problem: Pain Managment: Goal: General experience of comfort will improve 12/24/2021 1114 by Karolee Ohs, RN Outcome: Adequate for Discharge 12/24/2021 1114 by Karolee Ohs, RN Outcome: Progressing   Problem: Safety: Goal: Ability to remain free from injury will improve 12/24/2021 1114 by Karolee Ohs, RN Outcome: Adequate for Discharge 12/24/2021 1114 by Karolee Ohs, RN Outcome: Progressing   Problem: Skin Integrity: Goal: Risk for impaired skin integrity will decrease 12/24/2021 1114 by Karolee Ohs, RN Outcome: Adequate for Discharge 12/24/2021 1114 by Karolee Ohs, RN Outcome: Progressing

## 2021-12-24 NOTE — Plan of Care (Signed)

## 2021-12-25 LAB — URINE CULTURE
Culture: NO GROWTH
Special Requests: NORMAL

## 2021-12-26 LAB — AEROBIC/ANAEROBIC CULTURE W GRAM STAIN (SURGICAL/DEEP WOUND): Gram Stain: NONE SEEN

## 2021-12-26 LAB — CULTURE, BLOOD (ROUTINE X 2)
Culture: NO GROWTH
Culture: NO GROWTH
Special Requests: ADEQUATE
Special Requests: ADEQUATE

## 2021-12-27 ENCOUNTER — Telehealth: Payer: Self-pay | Admitting: *Deleted

## 2021-12-27 NOTE — Patient Outreach (Signed)
Care Coordination  12/27/2021  Dorothy Strong [birth year] [id number]  Transition Care Management Unsuccessful Follow-up Telephone Call  Date of discharge and from where:  [name]  Attempts:  1st Attempt  Reason for unsuccessful TCM follow-up call:  Unable to leave message   Estanislado Emms RN, BSN [place]  [facility] RN Care Coordinator

## 2021-12-28 ENCOUNTER — Telehealth: Payer: Self-pay | Admitting: *Deleted

## 2021-12-28 ENCOUNTER — Ambulatory Visit (INDEPENDENT_AMBULATORY_CARE_PROVIDER_SITE_OTHER): Payer: Medicaid Other | Admitting: Orthopedic Surgery

## 2021-12-28 DIAGNOSIS — L02413 Cutaneous abscess of right upper limb: Secondary | ICD-10-CM

## 2021-12-28 MED ORDER — LEVOFLOXACIN 500 MG PO TABS: 500.0000 mg | ORAL_TABLET | Freq: Every day | ORAL | 0 refills | Status: AC

## 2021-12-28 NOTE — Patient Outreach (Signed)
Care Coordination  12/28/2021  Dorothy Strong 09-21-1985 [id number]  Transition Care Management Unsuccessful Follow-up Telephone Call  Date of discharge and from where:  [facility]/24/21[name]  Attempts:  2nd Attempt  Reason for unsuccessful TCM follow-up call:  Unable to leave message   Estanislado Emms RN, BSN [place]  [facility] RN Care Coordinator

## 2021-12-28 NOTE — Patient Instructions (Addendum)
Wet to dry dressings, change twice daily.  This was demonstrated in clinic today.  Complete these dressings for the next 7-10 days  Take the new antibiotic until completion.  Can discontinue the previous antibiotics.  Follow-up in approximately 2 weeks.

## 2021-12-29 ENCOUNTER — Other Ambulatory Visit: Payer: Medicaid Other | Admitting: Obstetrics and Gynecology

## 2021-12-29 ENCOUNTER — Encounter: Payer: Self-pay | Admitting: Orthopedic Surgery

## 2021-12-29 NOTE — Progress Notes (Signed)
Orthopaedic Postop Note  Assessment: Dorothy Strong is a 36 y.o. female s/p I&D of dorsal right forearm abscess  DOS: 12/22/2021  Plan: Sutures removed from the dorsal left forearm. Wet-to-dry dressings twice daily.  Continue these dressing changes for the next 7-10 days. Based on susceptibilities, transition her to Levaquin for 7 days. Overall, the wound and abscess are healing very well.  Follow-up in 2 weeks  Meds ordered this encounter  Medications   levofloxacin (LEVAQUIN) 500 MG tablet    Sig: Take 1 tablet (500 mg total) by mouth daily for 7 days.    Dispense:  7 tablet    Refill:  0     Follow-up: Return for 2-3 weeks . XR at next visit: None  Subjective:  Chief Complaint  Patient presents with   Arm Injury    DOS 12/22/21 I&D abscess forearm    History of Present Illness: Dorothy Strong is a 36 y.o. female who presents following the above stated procedure.  She was discharged on POD #2, after removal of the Penrose drain.  Since then, she continues to improve.  She has been taking Keflex and doxycycline.  No fevers or chills.  She does continue to have pain, but this is improving.  She has been changing her dressings daily.  Review of Systems: No fevers or chills No numbness or tingling No Chest Pain No shortness of breath   Objective: LMP 11/14/2021 (Approximate) Comment: hcg negative 12/21/21  Physical Exam:  Alert and oriented.  No acute distress.  Dorsal right forearm abscess, with previously necrotic tissue is healthy appearing there is some fibrinous tissue overlying some beefy red granulation tissue.  Sensation is intact throughout the right hand.  The small counterincision is healing well, without surrounding erythema or drainage.  She is able to make a fist.  Fingers are warm and well-perfused.  IMAGING: I personally ordered and reviewed the following images:  No new imaging obtained today.  Oliver Barre, MD 12/29/2021 12:19 PM

## 2021-12-29 NOTE — Patient Outreach (Signed)
Transition Care Management Follow-up Telephone Call Date of discharge and from where: 12/24/21 from [facility] How have you been since you were released from the hospital? good Any questions or concerns? No  Items Reviewed: Did the pt receive and understand the discharge instructions provided? Yes  Medications obtained and verified? Yes  Other? No  Any new allergies since your discharge? No  Dietary orders reviewed? Yes Do you have support at home? No   Home Care and Equipment/Supplies: Were home health services ordered? no If so, what is the name of the agency? N/A  Has the agency set up a time to come to the patient's home? not applicable Were any new equipment or medical supplies ordered?  No What is the name of the medical supply agency? N/A Were you able to get the supplies/equipment? not applicable Do you have any questions related to the use of the equipment or supplies? No  Functional Questionnaire: (I = Independent and D = Dependent) ADLs: I  Bathing/Dressing- I  Meal Prep- I  Eating- I  Maintaining continence- I  Transferring/Ambulation- I  Managing Meds- I  Follow up appointments reviewed:  PCP Hospital f/u appt confirmed? Yes  Scheduled to see Judd Lien on 12/29/21 Specialist Hospital f/u appt confirmed? Yes  Scheduled to see Judd Lien on 01/11/22  Are transportation arrangements needed? No  If their condition worsens, is the pt aware to call PCP or go to the Emergency Dept.? Yes Was the patient provided with contact information for the PCP's office or ED? Yes Was to pt encouraged to call back with questions or concerns? Yes

## 2022-01-11 ENCOUNTER — Encounter: Payer: Medicaid Other | Admitting: Orthopedic Surgery

## 2022-01-11 ENCOUNTER — Encounter: Payer: Self-pay | Admitting: Orthopedic Surgery

## 2022-09-07 ENCOUNTER — Encounter: Payer: Self-pay | Admitting: Radiology
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
## 2023-01-02 IMAGING — DX DG FOREARM 2V*R*
2 series · 2 of 2 positions shown · non-contrast
Comparison: None Available.

CLINICAL DATA: Dorsal wrist abscess.

EXAM:
RIGHT FOREARM - 2 VIEW

[forearm ap]
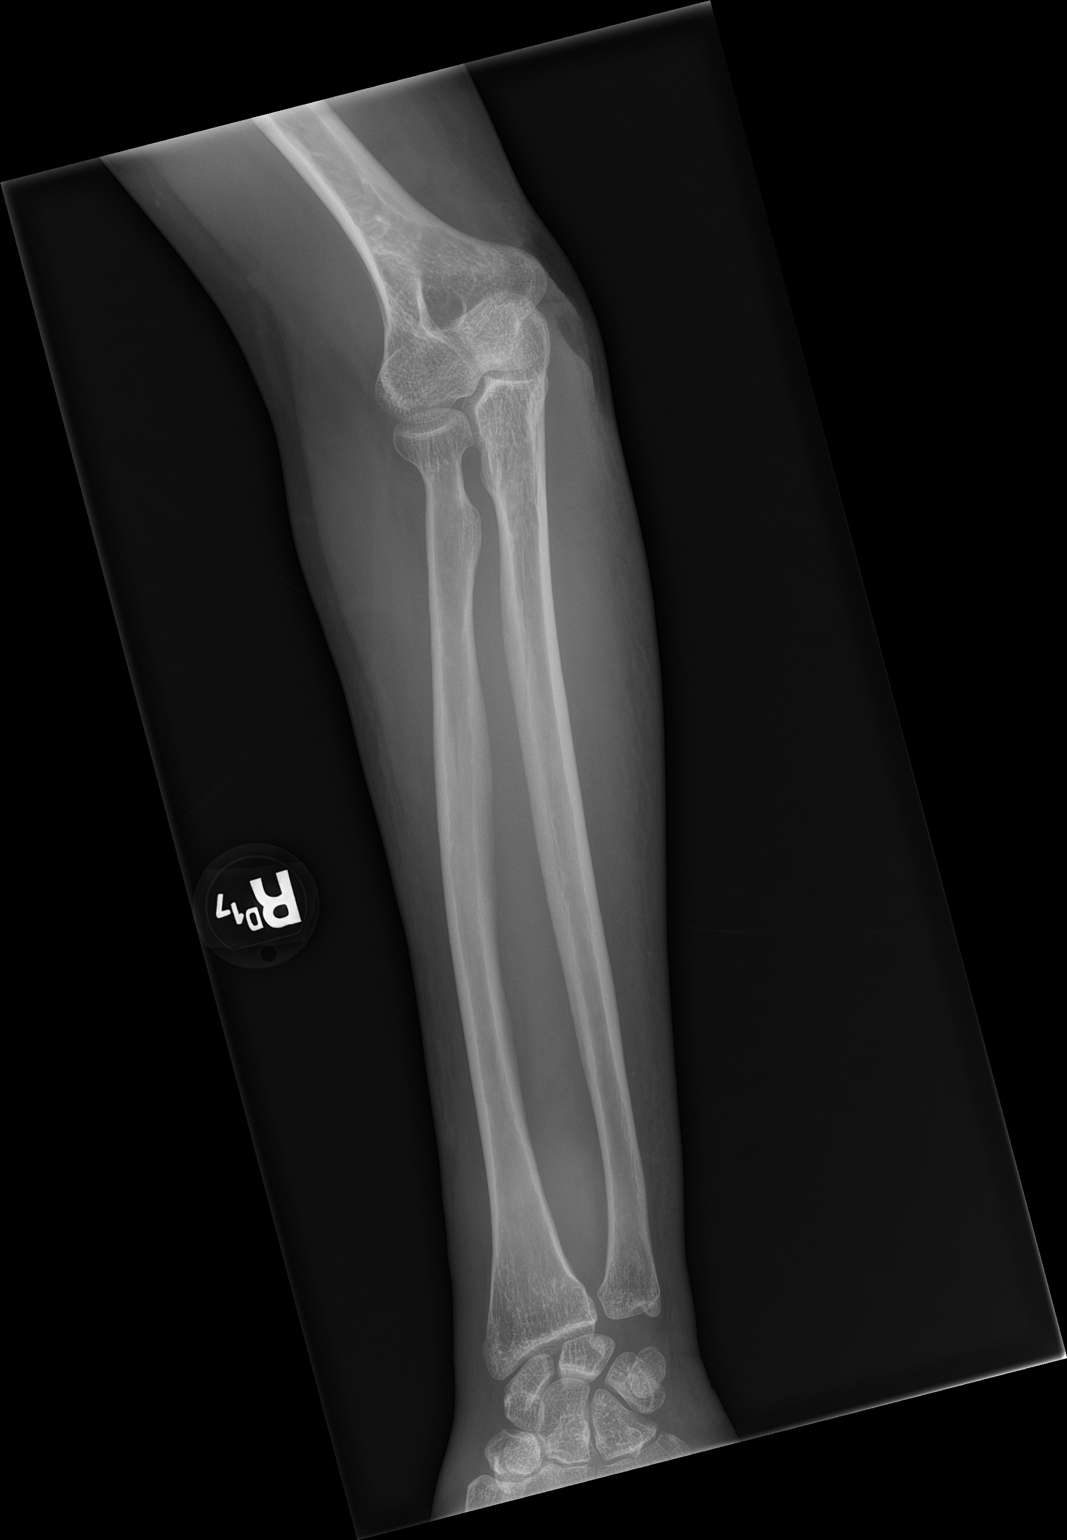

[forearm lat]
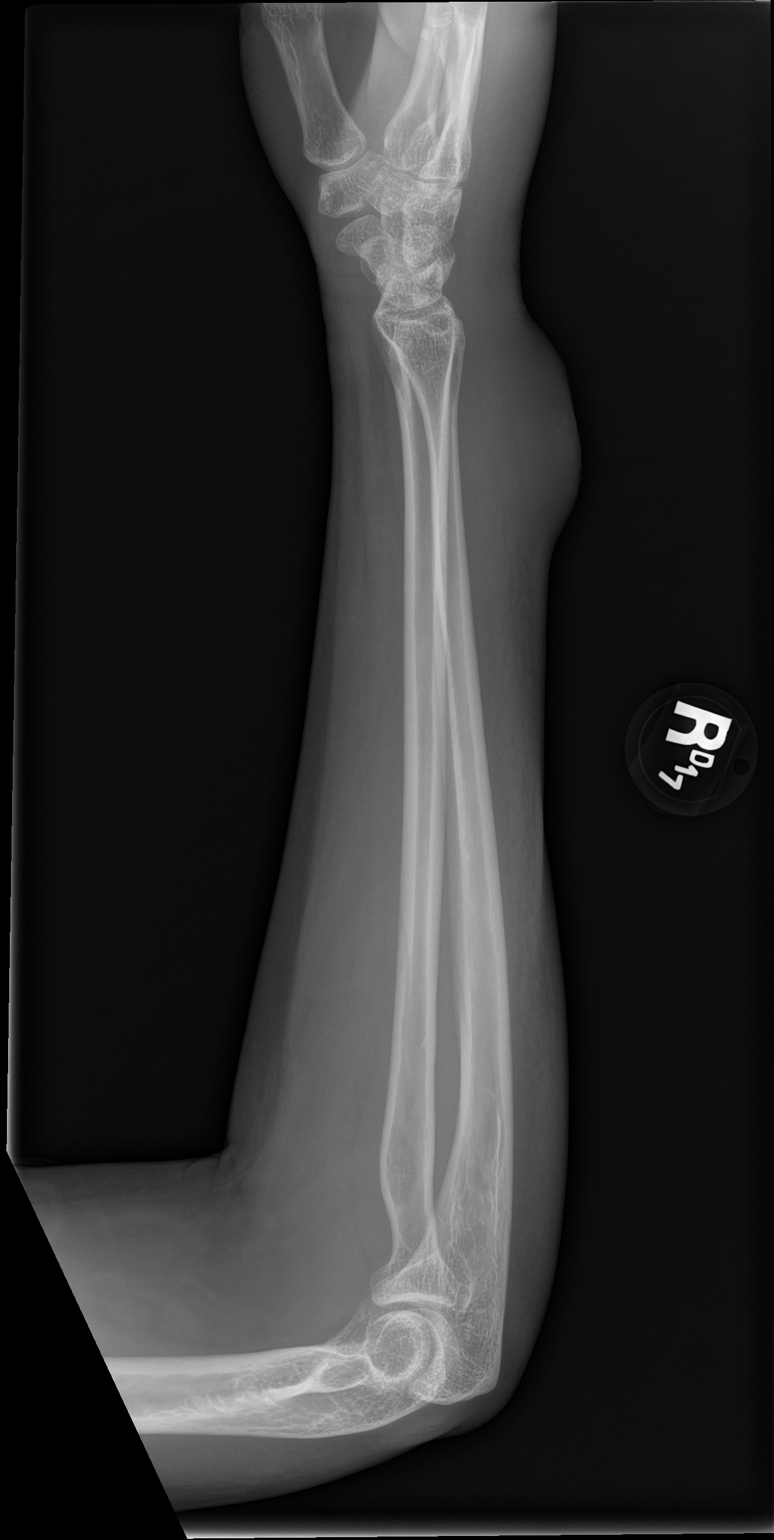

[2 of 2 positions shown; findings below may reference images not displayed]

FINDINGS: The wrist and elbow joints are maintained. No acute bony findings
are identified. There is a large subcutaneous soft tissue
protuberance noted along the dorsal aspect of the distal
forearm/wrist.
IMPRESSION: No bony abnormalities.
